# Patient Record
Sex: Male | Born: 1975 | Race: White | Hispanic: No | Smoking: Former smoker
Health system: Southern US, Community
[De-identification: ages and names within clinical notes are randomized; demographics above are authoritative.]

## PROBLEM LIST (undated history)

## (undated) DIAGNOSIS — I10 Essential (primary) hypertension: Secondary | ICD-10-CM

## (undated) DIAGNOSIS — T7840XA Allergy, unspecified, initial encounter: Secondary | ICD-10-CM

## (undated) DIAGNOSIS — E119 Type 2 diabetes mellitus without complications: Secondary | ICD-10-CM

## (undated) DIAGNOSIS — D649 Anemia, unspecified: Secondary | ICD-10-CM

## (undated) DIAGNOSIS — F419 Anxiety disorder, unspecified: Secondary | ICD-10-CM

## (undated) HISTORY — DX: Anxiety disorder, unspecified: F41.9

## (undated) HISTORY — DX: Type 2 diabetes mellitus without complications: E11.9

## (undated) HISTORY — PX: PLANTAR FASCIA SURGERY: SHX746

## (undated) HISTORY — DX: Anemia, unspecified: D64.9

## (undated) HISTORY — PX: CARPAL TUNNEL RELEASE: SHX101

## (undated) HISTORY — DX: Allergy, unspecified, initial encounter: T78.40XA

## (undated) HISTORY — PX: SPLENECTOMY, TOTAL: SHX788

---

## 2005-01-17 ENCOUNTER — Emergency Department: Payer: Self-pay | Admitting: General Practice

## 2007-08-18 ENCOUNTER — Emergency Department (HOSPITAL_COMMUNITY): Admission: EM | Admit: 2007-08-18 | Discharge: 2007-08-19 | Payer: Self-pay | Admitting: Emergency Medicine

## 2009-04-22 ENCOUNTER — Ambulatory Visit: Payer: Self-pay | Admitting: Orthopedic Surgery

## 2009-05-12 ENCOUNTER — Ambulatory Visit: Payer: Self-pay | Admitting: Orthopedic Surgery

## 2010-06-08 ENCOUNTER — Ambulatory Visit: Payer: Self-pay | Admitting: Sports Medicine

## 2010-12-14 LAB — COMPREHENSIVE METABOLIC PANEL
ALT: 34
Alkaline Phosphatase: 78
BUN: 12
CO2: 28
Chloride: 104
GFR calc non Af Amer: >60
Glucose, Bld: 91
Potassium: 4.4
Sodium: 140
Total Bilirubin: 0.7
Total Protein: 6.7

## 2010-12-14 LAB — URINE CULTURE
Colony Count: NO GROWTH
Culture: NO GROWTH

## 2010-12-14 LAB — CBC
HCT: 42.6
Hemoglobin: 15.3
RBC: 4.96
RDW: 12.1

## 2010-12-14 LAB — DIFFERENTIAL
Basophils Absolute: 0.1
Eosinophils Absolute: 0.3
Lymphocytes Relative: 20
Monocytes Absolute: 2.7 — ABNORMAL HIGH
Neutrophils Relative %: 57

## 2010-12-14 LAB — URINALYSIS, ROUTINE W REFLEX MICROSCOPIC
Glucose, UA: NEGATIVE
Hgb urine dipstick: NEGATIVE
Ketones, ur: NEGATIVE
Protein, ur: NEGATIVE
Urobilinogen, UA: 0.2

## 2010-12-14 LAB — CULTURE, BLOOD (ROUTINE X 2)
Culture: NO GROWTH
Culture: NO GROWTH

## 2010-12-14 LAB — ROCKY MTN SPOTTED FVR AB, IGM-BLOOD: RMSF IgM: 0.32 IV

## 2010-12-14 LAB — D-DIMER, QUANTITATIVE: D-Dimer, Quant: 0.51 — ABNORMAL HIGH

## 2010-12-14 LAB — ROCKY MTN SPOTTED FVR AB, IGG-BLOOD: RMSF IgG: <1:64 {titer}

## 2011-02-27 ENCOUNTER — Encounter: Payer: Self-pay | Admitting: Family Medicine

## 2011-02-27 ENCOUNTER — Ambulatory Visit (INDEPENDENT_AMBULATORY_CARE_PROVIDER_SITE_OTHER): Payer: BC Managed Care – PPO | Admitting: Family Medicine

## 2011-02-27 DIAGNOSIS — J069 Acute upper respiratory infection, unspecified: Secondary | ICD-10-CM

## 2011-02-27 DIAGNOSIS — Z8249 Family history of ischemic heart disease and other diseases of the circulatory system: Secondary | ICD-10-CM

## 2011-02-27 DIAGNOSIS — Z9081 Acquired absence of spleen: Secondary | ICD-10-CM

## 2011-02-27 DIAGNOSIS — D649 Anemia, unspecified: Secondary | ICD-10-CM

## 2011-02-27 DIAGNOSIS — E669 Obesity, unspecified: Secondary | ICD-10-CM

## 2011-02-27 DIAGNOSIS — Z9089 Acquired absence of other organs: Secondary | ICD-10-CM

## 2011-02-27 DIAGNOSIS — F17200 Nicotine dependence, unspecified, uncomplicated: Secondary | ICD-10-CM

## 2011-02-27 DIAGNOSIS — K6289 Other specified diseases of anus and rectum: Secondary | ICD-10-CM

## 2011-02-27 NOTE — Patient Instructions (Signed)
Let me know when you want to stop smoking. Let me know if the pain gets worse or more frequent. The cough and runny nose should gradually get better.  I would get a flu shot each fall (and as soon as you are feeling well this fall).   We'll request your records.  Come back for fasting labs in about 1 month.  You can get your results through our phone system.  Follow the instructions on the blue card. Take care.  Glad to see you.  I would get back in the gym when you are feeling better.

## 2011-02-28 ENCOUNTER — Encounter: Payer: Self-pay | Admitting: Family Medicine

## 2011-02-28 DIAGNOSIS — E669 Obesity, unspecified: Secondary | ICD-10-CM | POA: Insufficient documentation

## 2011-02-28 DIAGNOSIS — J40 Bronchitis, not specified as acute or chronic: Secondary | ICD-10-CM | POA: Insufficient documentation

## 2011-02-28 DIAGNOSIS — Z9081 Acquired absence of spleen: Secondary | ICD-10-CM | POA: Insufficient documentation

## 2011-02-28 DIAGNOSIS — K6289 Other specified diseases of anus and rectum: Secondary | ICD-10-CM | POA: Insufficient documentation

## 2011-02-28 DIAGNOSIS — F17200 Nicotine dependence, unspecified, uncomplicated: Secondary | ICD-10-CM | POA: Insufficient documentation

## 2011-02-28 NOTE — Assessment & Plan Note (Signed)
Possible transient pain from fissure, but normal exam today.   I would observe for now and if progressive he'll notify me.

## 2011-02-28 NOTE — Assessment & Plan Note (Signed)
requesting records

## 2011-02-28 NOTE — Assessment & Plan Note (Signed)
Precontemplative.  Cessation encouraged.

## 2011-02-28 NOTE — Assessment & Plan Note (Signed)
Resolving, likely viral, f/u prn.

## 2011-02-28 NOTE — Assessment & Plan Note (Signed)
Work on diet and exercise to lose weight.  He's done it before.  Goal to get weight off and keep it off.

## 2011-02-28 NOTE — Progress Notes (Signed)
New pt.  Records requested from [facility] clinic.   Pain in rectum, near perineum, episodic, brief, only with a BM, but not with all BMs.  Happens about 1x/month.  No blood in stool.    Recent URI.  Fever, eye watering, rhinorrhea, improved now.    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  Requesting records.   This appears to be familial, mult relatives with similar and s/p splenectomy.   Smoking, not yet ready to quit.    Overweight. Had lost ~100 lbs, but regained.  We talked about this today.    PMH and SH reviewed  ROS: See HPI, otherwise noncontributory.  Meds, vitals, and allergies reviewed.   GEN: nad, alert and oriented HEENT: mucous membranes moist, tm wnl, minimal nasal injection, op with minimal cobblestoning but no exudates NECK: supple w/o LA CV: rrr. PULM: ctab, no inc wob ABD: soft, +bs, old scar noted EXT: no edema SKIN: no acute rash Rectal exam- external- with no fissure noted, no thrombosed hemorrhoid

## 2011-03-16 ENCOUNTER — Encounter: Payer: Self-pay | Admitting: Family Medicine

## 2011-03-30 ENCOUNTER — Other Ambulatory Visit: Payer: BC Managed Care – PPO

## 2011-08-29 ENCOUNTER — Ambulatory Visit (INDEPENDENT_AMBULATORY_CARE_PROVIDER_SITE_OTHER): Payer: BC Managed Care – PPO | Admitting: Family Medicine

## 2011-08-29 ENCOUNTER — Encounter: Payer: Self-pay | Admitting: Family Medicine

## 2011-08-29 VITALS — BP 116/72 | HR 68 | Temp 97.8°F | Wt 304.8 lb

## 2011-08-29 DIAGNOSIS — J40 Bronchitis, not specified as acute or chronic: Secondary | ICD-10-CM

## 2011-08-29 DIAGNOSIS — Z9089 Acquired absence of other organs: Secondary | ICD-10-CM

## 2011-08-29 DIAGNOSIS — Z9081 Acquired absence of spleen: Secondary | ICD-10-CM

## 2011-08-29 MED ORDER — ALBUTEROL SULFATE HFA 108 (90 BASE) MCG/ACT IN AERS: 2.0000 | INHALATION_SPRAY | Freq: Four times a day (QID) | RESPIRATORY_TRACT | Status: DC | PRN

## 2011-08-29 MED ORDER — AZITHROMYCIN 250 MG PO TABS: ORAL_TABLET | ORAL | Status: AC

## 2011-08-29 NOTE — Progress Notes (Signed)
Sinus and chest congestion.  Going on about 1 week.  Burning in nose initally then a lot of nasal discharge.  No fevers.  Not sob, but more wheezing than normal. Some sputum.  No ST.  No myalgias.  He's not getting worse but isn't sig better in last few days.  Prev with upper tooth pain, bilaterally.  No ear pain, but ears feel stuffy.  Smoking 1 PPD, discussed.  He's not ready to quit yet.  Asplenic.    Meds, vitals, and allergies reviewed.   ROS: See HPI.  Otherwise, noncontributory.  nad ncat Tm wnl Nasal exam stuffy OP w/o exudates Mmm Neck supple rrr Coarse BS and scant wheeze/rhonchi Frontal sinus ttp B Ext w/o edema

## 2011-08-29 NOTE — Assessment & Plan Note (Signed)
Needs another PNA shot this summer.  D/w pt .  He understands.

## 2011-08-29 NOTE — Assessment & Plan Note (Signed)
Start zmax.  Use SABA.  Needs to stop smoking.  Pre contemplative.  Nontoxic.  See instructions.

## 2011-08-29 NOTE — Patient Instructions (Signed)
Start the antibiotics today, use the inhaler as needed and let me know if I can help you stop smoking.   Call about getting a repeat pneumonia shot later this summer when you are feeling better.

## 2011-11-07 ENCOUNTER — Telehealth: Payer: Self-pay | Admitting: Family Medicine

## 2011-11-07 ENCOUNTER — Telehealth: Payer: Self-pay

## 2011-11-07 NOTE — Telephone Encounter (Signed)
[name] with [facility]; pt stepped on nail 11/06/11; Angelique Blonder not sure if rusty or new nail;[name] to get more info; Angelique Blonder has checked [place] registry and pts chart for immunizations; unable to locate; Could not find on scanned [facility] notes; Pt is not in centricity and does not have paper chart here. Angelique Blonder said will make appt for pt to be seen and f/u on tetanus immun.

## 2011-11-07 NOTE — Telephone Encounter (Signed)
If this punctured the skin then I would advise him to be seen today, either here or UC.

## 2011-11-07 NOTE — Telephone Encounter (Signed)
Caller: Tammy/Spouse; Patient Name: [name]; PCP: Crawford Givens Clelia Croft); Best Callback Phone Number: [phone].  Spouse is calling to find out tetanus info for pt. Spouse is unsure of last tetanus shot. No tetanus immunization record found in the EMR nor [facility]. Office could not find information either.   Pt stepped on nail 11/06/11 and is it sore. No signs of infection noted.  All emergent symptoms of Abrasions, Lacerations, Puncture Wounds Protocol ruled out with exception to 'Tetanus immunization not up to date'.  Pt offered appt for 11/08/11 but spouse did not know pts work schedule.  Spouse to call back later for appt.

## 2011-11-07 NOTE — Telephone Encounter (Signed)
Called patient and he says the nail did go up into his foot a little bit.  It bled a little but not much.  He thinks his Tetanus shot is up to date but as of now, he nor [facility] have been able to document that.  Dr. Para March recommends that he be seen today, apparently at [facility] now.  Patient advised and says he doesn't think he can go to UC today.  Appt made with Dr. Patsy Lager for 11/08/11.

## 2011-11-08 ENCOUNTER — Ambulatory Visit: Payer: BC Managed Care – PPO | Admitting: Family Medicine

## 2012-01-05 ENCOUNTER — Telehealth: Payer: Self-pay

## 2012-01-05 NOTE — Telephone Encounter (Signed)
Pts wife left v/m;Pt already has appt to see Dr Para March 01/11/12 for shoulder numbness and dizziness; pt usually feels better after eats. Should pt come prior to appt for fasting labs.Please advise.

## 2012-01-07 NOTE — Telephone Encounter (Signed)
Was supposed to come back for fasting labs months ago.  Orders are still in.

## 2012-01-08 NOTE — Telephone Encounter (Signed)
LMOVM to Tammy.

## 2012-01-10 ENCOUNTER — Other Ambulatory Visit (INDEPENDENT_AMBULATORY_CARE_PROVIDER_SITE_OTHER): Payer: BC Managed Care – PPO

## 2012-01-10 DIAGNOSIS — D649 Anemia, unspecified: Secondary | ICD-10-CM

## 2012-01-10 DIAGNOSIS — Z8249 Family history of ischemic heart disease and other diseases of the circulatory system: Secondary | ICD-10-CM

## 2012-01-10 LAB — CBC WITH DIFFERENTIAL/PLATELET
Basophils Relative: 0.5 % (ref 0.0–3.0)
HCT: 45.4 % (ref 39.0–52.0)
Hemoglobin: 15.7 g/dL (ref 13.0–17.0)
Lymphocytes Relative: 23.1 % (ref 12.0–46.0)
Lymphs Abs: 2.9 10*3/uL (ref 0.7–4.0)
Monocytes Relative: 10.1 % (ref 3.0–12.0)
Neutro Abs: 8.1 10*3/uL — ABNORMAL HIGH (ref 1.4–7.7)
RBC: 5.2 Mil/uL (ref 4.22–5.81)

## 2012-01-10 LAB — LIPID PANEL
HDL: 24.6 mg/dL — ABNORMAL LOW (ref >39.00)
LDL Cholesterol: 133 mg/dL — ABNORMAL HIGH (ref 0–99)
Total CHOL/HDL Ratio: 7
VLDL: 19.2 mg/dL (ref 0.0–40.0)

## 2012-01-10 LAB — GLUCOSE, RANDOM: Glucose, Bld: 117 mg/dL — ABNORMAL HIGH (ref 70–99)

## 2012-01-11 ENCOUNTER — Ambulatory Visit: Payer: BC Managed Care – PPO | Admitting: Family Medicine

## 2012-01-12 ENCOUNTER — Ambulatory Visit (INDEPENDENT_AMBULATORY_CARE_PROVIDER_SITE_OTHER): Payer: BC Managed Care – PPO | Admitting: Family Medicine

## 2012-01-12 ENCOUNTER — Encounter: Payer: Self-pay | Admitting: Family Medicine

## 2012-01-12 VITALS — BP 114/70 | HR 75 | Temp 98.6°F | Wt 308.0 lb

## 2012-01-12 DIAGNOSIS — R7309 Other abnormal glucose: Secondary | ICD-10-CM

## 2012-01-12 DIAGNOSIS — R739 Hyperglycemia, unspecified: Secondary | ICD-10-CM

## 2012-01-12 DIAGNOSIS — M25519 Pain in unspecified shoulder: Secondary | ICD-10-CM

## 2012-01-12 NOTE — Progress Notes (Signed)
R handed.  He'll get unilateral and occ bilateral aching/burning in shoulders.  One can happen w/o the other.  No trauma to explain the sx. No neck or back pain.    He'll get some L arm changes in sensation (but not absence of sensation) but this doesn't happen at the same time as the shoulder pain listed above.  All going on for about 1 year. Sensation changes happen about 2x/month.  No triggers with movements, lifting.  No trauma, falls, MVC etc.  H/o heavy weight lifting in the past. H/o CTS surgery in past.    Hyperglycemia. Noted on labs.  D/w pt about weight and need to monitor diet.  Will plan to recheck sugar in another 6 months.  He agrees.    Meds, vitals, and allergies reviewed.   ROS: See HPI.  Otherwise, noncontributory.  nad ncat Mmm Neck supple, normal neck ROM, no midline pain Normal shoulder rom w/o cuff impingement testing B but AC joints ttp B No tingling in the hand with wrist flexion or tapping the flexor retinaculum.

## 2012-01-12 NOTE — Patient Instructions (Addendum)
Recheck sugar in 6 months.  Fasting.  Work on losing weight.  Take ibuprofen (up to) 600mg  three times a day with food.  See if that helps.   If you aren't improving, we'll need to get you over to the ortho clinic.  Take care.

## 2012-01-14 DIAGNOSIS — Z8639 Personal history of other endocrine, nutritional and metabolic disease: Secondary | ICD-10-CM | POA: Insufficient documentation

## 2012-01-14 DIAGNOSIS — M25519 Pain in unspecified shoulder: Secondary | ICD-10-CM | POA: Insufficient documentation

## 2012-01-14 NOTE — Assessment & Plan Note (Signed)
Take ibuprofen (up to) 600mg  three times a day with food. If you aren't improving, we'll refer to the ortho clinic. He has a history of heavy weight lifting and this is likely playing a role.  I don't see the obvious cause of the intermittent sensation changes.  He has normal strength and sensation today, normal DTRs.  Would follow clinically.  If progressive, we'll w/u.

## 2012-01-14 NOTE — Assessment & Plan Note (Signed)
D/w pt about diet, weight loss, recheck sugar in 6 months.

## 2013-01-22 ENCOUNTER — Encounter: Payer: Self-pay | Admitting: Podiatry

## 2013-01-22 ENCOUNTER — Ambulatory Visit (INDEPENDENT_AMBULATORY_CARE_PROVIDER_SITE_OTHER): Payer: BC Managed Care – PPO | Admitting: Podiatry

## 2013-01-22 ENCOUNTER — Ambulatory Visit (INDEPENDENT_AMBULATORY_CARE_PROVIDER_SITE_OTHER): Payer: BC Managed Care – PPO

## 2013-01-22 VITALS — BP 131/75 | HR 90 | Resp 16 | Ht 72.0 in | Wt 315.0 lb

## 2013-01-22 DIAGNOSIS — Q6650 Congenital pes planus, unspecified foot: Secondary | ICD-10-CM

## 2013-01-22 DIAGNOSIS — M19079 Primary osteoarthritis, unspecified ankle and foot: Secondary | ICD-10-CM

## 2013-01-22 DIAGNOSIS — B353 Tinea pedis: Secondary | ICD-10-CM

## 2013-01-22 DIAGNOSIS — M79671 Pain in right foot: Secondary | ICD-10-CM

## 2013-01-22 DIAGNOSIS — M79609 Pain in unspecified limb: Secondary | ICD-10-CM

## 2013-01-22 MED ORDER — METHYLPREDNISOLONE (PAK) 4 MG PO TABS: ORAL_TABLET | ORAL | Status: DC

## 2013-01-22 MED ORDER — MELOXICAM 15 MG PO TABS: 15.0000 mg | ORAL_TABLET | Freq: Every day | ORAL | Status: DC

## 2013-01-22 NOTE — Progress Notes (Signed)
N HURT L B/L FOOT-DORSAL D 45M O SLOWLY C WORSE A STANDING T 0   N NUMB L LEFT HEEL D 45M O SLOWLY C WORSE A 0 T 0

## 2013-01-22 NOTE — Progress Notes (Signed)
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 He states that he and his wife her on her feet all the time. Over the past month his developed pain throughout his mid foot arch and dorsal aspect of the foot this seems to be worsening. Seems to be better and shoe gear and then after he gets out of his shoe gear is painful to walk. He states he also has area of numbness centrally located in the plantar aspect of his left heel. He denies any trauma or any back abnormalities. 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 He did nothing to try to help with his pain.  Objective: I have reviewed his past medical history medications allergies and surgeries as well as his review of systems. Vital signs are stable he is alert and oriented x3. Vascular evaluation demonstrates strong palpable pulses +2/4 DP and PT bilateral capillary fill time to digits one through 5 bilateral foot is immediate. Neurologic sensorium per since once the monofilament is intact bilateral. Deep tendon reflexes are intact and symmetrical bilateral. Muscle strength is +5 over 5 dorsiflexors plantar flexors inverters and evertors bilaterally. All intrinsic musculature is intact. Orthopedic evaluation demonstrates pain on palpation to the dorsal aspect of the midfoot. Frontal plane range of motion bilateral foot. Pes planus flexible in nature bilateral. Radiographic evaluation demonstrates early osteoarthritic changes dorsal midfoot lateral view bilateral. Right first metatarsophalangeal joint demonstrates moderate osteoarthritic changes with hallux limitus. Cutaneous evaluation demonstrates supple well hydrated cutis to the dorsal aspect of the bilateral foot. Plantar aspect of the foot does demonstrate reactive hyperkeratosis, bromhidrosis and minor fissuring. He has a small reactive hyperkeratotic lesion plantar medial aspect great toe left  foot.  Assessment: Pes planus bilateral. Midfoot osteoarthritic changes bilateral. Hallux limitus first metatarsophalangeal joint right. Tinea pedis bilateral.  Plan: At this point I prescribed him orthotics. He was scan today. Wrote him a prescription for Sterapred Dosepak to be followed by Mobic. And we discussed a little chloride application for the bromhidrosis. Also discussed appropriate shoe gear stretching exercises ice therapy. Followup with him with his orthotics committed.

## 2013-03-17 ENCOUNTER — Encounter: Payer: Self-pay | Admitting: Podiatry

## 2013-03-17 ENCOUNTER — Ambulatory Visit (INDEPENDENT_AMBULATORY_CARE_PROVIDER_SITE_OTHER): Payer: BC Managed Care – PPO | Admitting: Podiatry

## 2013-03-17 VITALS — BP 123/77 | HR 96 | Resp 16 | Ht 72.0 in | Wt 320.0 lb

## 2013-03-17 DIAGNOSIS — M722 Plantar fascial fibromatosis: Secondary | ICD-10-CM

## 2013-03-17 NOTE — Patient Instructions (Signed)

## 2013-03-17 NOTE — Progress Notes (Signed)
Rider presents today to pick up his orthotics for his flat feet and plantar fasciitis left.  Objective: Pulses are palpable bilateral. Pain on palpation medial calcaneal tubercle left heel.  Assessment: Plantar fasciitis left pes planus bilateral.  Plan: Since orthotics today. Was given both oral and written home-going instructions for the use of his orthotics. Was injected his left heel today with Kenalog and local anesthetic.

## 2013-04-14 ENCOUNTER — Ambulatory Visit: Payer: BC Managed Care – PPO | Admitting: Podiatry

## 2013-06-26 ENCOUNTER — Ambulatory Visit (INDEPENDENT_AMBULATORY_CARE_PROVIDER_SITE_OTHER): Payer: BC Managed Care – PPO | Admitting: Podiatry

## 2013-06-26 VITALS — Resp 16 | Ht 72.0 in | Wt 320.0 lb

## 2013-06-26 DIAGNOSIS — M722 Plantar fascial fibromatosis: Secondary | ICD-10-CM

## 2013-06-26 NOTE — Progress Notes (Signed)
He presents today stating that he has had no relief from conservative therapies of plantar fasciitis of his left heel. He states that his heel is been hurting so bad that he had active is the warts up out of work April 2. He can hardly bear weight on this foot particularly in the morning and then again at the end of the day.  Objective: Vital signs are stable he is alert and oriented x3. He has severe pain on palpation medial calcaneal tubercle of the left heel. He has tenderness on lateral aspect of the fifth metatarsal base and fifth metatarsal head left foot.  Assessment: Chronic intractable plantar fasciitis left heel.  Plan: Discussed etiology pathology conservative versus surgical therapies. I put him in a Cam Walker and scheduled him for an MRI of his left heel secondary to chronic intractable plantar fasciitis of his left foot. Once the MRI comes back positive for under fasciitis endoscopic plantar fasciotomy will be performed.

## 2013-07-02 ENCOUNTER — Telehealth: Payer: Self-pay | Admitting: *Deleted

## 2013-07-02 NOTE — Telephone Encounter (Signed)
Per Dr. Milinda Pointer, I called and informed the patient that we received his MRI results.   Dr. Milinda Pointer wants him to schedule a follow-up visit.  I transferred him to a scheduler.

## 2013-07-07 ENCOUNTER — Ambulatory Visit (INDEPENDENT_AMBULATORY_CARE_PROVIDER_SITE_OTHER): Payer: BC Managed Care – PPO | Admitting: Podiatry

## 2013-07-07 VITALS — Resp 16 | Ht 72.0 in | Wt 320.0 lb

## 2013-07-07 DIAGNOSIS — M722 Plantar fascial fibromatosis: Secondary | ICD-10-CM

## 2013-07-07 NOTE — Progress Notes (Signed)
He's here today for his MRI read.  Objective: Vital signs are stable he is alert and oriented x3 MRI report did come back positive for plantar fasciitis medial band left foot with posterior tibial tendinitis.  Assessment: Chronic intractable plantar fasciitis left foot.  Plan: We will over consent form today line bylined number by number giving him ask questions he saw fit regarding an endoscopic plantar fasciotomy left heel and strong questions regarding this procedure the best my ability in layman's terms she understood it was amenable to it signed all 3 pages of the consent form.

## 2013-07-08 ENCOUNTER — Encounter: Payer: Self-pay | Admitting: Podiatry

## 2013-07-11 ENCOUNTER — Encounter: Payer: Self-pay | Admitting: Podiatry

## 2013-07-11 DIAGNOSIS — M722 Plantar fascial fibromatosis: Secondary | ICD-10-CM

## 2013-07-15 ENCOUNTER — Encounter: Payer: Self-pay | Admitting: *Deleted

## 2013-07-15 ENCOUNTER — Telehealth: Payer: Self-pay | Admitting: *Deleted

## 2013-07-15 NOTE — Telephone Encounter (Signed)
Called and spoke with pt regarding surgery on 4.24.15. States he is doing fine, elevating, staying off of foot, icing and taking all rx by dr Milinda Pointer. verified pts appt with him. Pt understood.

## 2013-07-15 NOTE — Progress Notes (Signed)
Endoscopic plantar fasciotomy (epf) left   Rx: Percocet 10/325 #40 0 refills take one to two tabs by mouth every 6 - 8 hrs for pain Phenergan 25 mg #30 0 refills take one by mouth every 6 - 8 hrs for nausea Keflex 500 mg #30 0 refills one by mouth three times daily

## 2013-07-17 ENCOUNTER — Ambulatory Visit (INDEPENDENT_AMBULATORY_CARE_PROVIDER_SITE_OTHER): Payer: BC Managed Care – PPO | Admitting: Podiatrist

## 2013-07-17 ENCOUNTER — Encounter: Payer: BC Managed Care – PPO | Admitting: Podiatry

## 2013-07-17 VITALS — BP 144/88 | HR 88 | Temp 98.7°F | Resp 16

## 2013-07-17 DIAGNOSIS — M722 Plantar fascial fibromatosis: Secondary | ICD-10-CM

## 2013-07-17 MED ORDER — MEPERIDINE HCL 50 MG PO TABS: 50.0000 mg | ORAL_TABLET | ORAL | Status: DC | PRN

## 2013-07-17 MED ORDER — PROMETHAZINE HCL 25 MG PO TABS: 25.0000 mg | ORAL_TABLET | Freq: Four times a day (QID) | ORAL | Status: DC | PRN

## 2013-07-17 NOTE — Progress Notes (Signed)
Subjective: Patient presents today1 week status post foot surgery of the left foot.  Date of surgery 07/11/2013-  EPF left foot. Patient denies nausea, vomiting, fevers, chills or night sweats.  Denies calf pain or tenderness to the operative side. Relates he does have a scratchy throat and felt feverish.  Temperature taken today and patient is afebrile. He has been wearing the boot as instructed.  He relates moderate pain along the medial incision site.   Objective:  Neurovascular status is intact with palpable pedal pulses DP and PT bilateral at 2+ out of 4. Neurological sensation is intact and unchanged as per prior to surgery. Excellent appearance of the postoperative foot is noted. Mild swelling at the medial incision site is seen.  No redness, no drainage, no malodor, no streaking or signs of infection are seen.  No calf pain is elicited with compression or with dorsiflexion;plantarflexion  Assessment: Status post Endoscopic Plantar Fascia release left foot  Plan:  Dressed the incision sites with sterile bandaids and applied an ace wrap as he has quite a large foot and believe the anklet would be too tight at this time for him to wear.  I recommended soaking the foot in epsom salt water after a shower, and reapplying the bandaids to the incision sites.  He will continue to wear the boot daily and at night.  He will be seen back in 1 week for suture removal and follow up.  He is also instructed to continue icing and elevation.   A prescription for Demerol and Phenergan was dispensed in hopes this would help with the scratchy throat feeling he gets when taking the oxycodone.

## 2013-07-21 ENCOUNTER — Ambulatory Visit (INDEPENDENT_AMBULATORY_CARE_PROVIDER_SITE_OTHER): Payer: BC Managed Care – PPO | Admitting: Internal Medicine

## 2013-07-21 ENCOUNTER — Encounter: Payer: Self-pay | Admitting: Internal Medicine

## 2013-07-21 VITALS — BP 128/82 | HR 89 | Temp 98.5°F | Wt 328.0 lb

## 2013-07-21 DIAGNOSIS — J309 Allergic rhinitis, unspecified: Secondary | ICD-10-CM

## 2013-07-21 NOTE — Patient Instructions (Addendum)
Allergic Rhinitis Allergic rhinitis is when the mucous membranes in the nose respond to allergens. Allergens are particles in the air that cause your body to have an allergic reaction. This causes you to release allergic antibodies. Through a chain of events, these eventually cause you to release histamine into the blood stream. Although meant to protect the body, it is this release of histamine that causes your discomfort, such as frequent sneezing, congestion, and an itchy, runny nose.  CAUSES  Seasonal allergic rhinitis (hay fever) is caused by pollen allergens that may come from grasses, trees, and weeds. Year-round allergic rhinitis (perennial allergic rhinitis) is caused by allergens such as house dust mites, pet dander, and mold spores.  SYMPTOMS   Nasal stuffiness (congestion).  Itchy, runny nose with sneezing and tearing of the eyes. DIAGNOSIS  Your health care provider can help you determine the allergen or allergens that trigger your symptoms. If you and your health care provider are unable to determine the allergen, skin or blood testing may be used. TREATMENT  Allergic Rhinitis does not have a cure, but it can be controlled by:  Medicines and allergy shots (immunotherapy).  Avoiding the allergen. Hay fever may often be treated with antihistamines in pill or nasal spray forms. Antihistamines block the effects of histamine. There are over-the-counter medicines that may help with nasal congestion and swelling around the eyes. Check with your health care provider before taking or giving this medicine.  If avoiding the allergen or the medicine prescribed do not work, there are many new medicines your health care provider can prescribe. Stronger medicine may be used if initial measures are ineffective. Desensitizing injections can be used if medicine and avoidance does not work. Desensitization is when a patient is given ongoing shots until the body becomes less sensitive to the allergen.  Make sure you follow up with your health care provider if problems continue. HOME CARE INSTRUCTIONS It is not possible to completely avoid allergens, but you can reduce your symptoms by taking steps to limit your exposure to them. It helps to know exactly what you are allergic to so that you can avoid your specific triggers. SEEK MEDICAL CARE IF:   You have a fever.  You develop a cough that does not stop easily (persistent).  You have shortness of breath.  You start wheezing.  Symptoms interfere with normal daily activities. Document Released: 11/29/2000 Document Revised: 12/25/2012 Document Reviewed: 11/11/2012 Exit[employer] Patient Information 2014 [employer].  

## 2013-07-21 NOTE — Progress Notes (Signed)
Pre visit review using our clinic review tool, if applicable. No additional management support is needed unless otherwise documented below in the visit note. 

## 2013-07-21 NOTE — Progress Notes (Signed)
 Subjective:    Patient ID: [name], male    DOB: 05/01/1975, 38 y.o.   MRN: [id number]  HPI  Pt presents to the clinic today with c/o cough and sore throat. He reports this started 1 week ago. He has had some pain with swallowing. The cough is productive of thick yellow phlegm. He has had some shortness of breath, headache, sinus pressure. He denies fever, chills or body aches. He has not tried anything OTC. He does have a history of allergies but denies breathing problems. He has has not had sick contacts that he is aware of.  Review of Systems      Past Medical History  Diagnosis Date  . Allergy   . 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 meperidine (DEMEROL) 50 MG tablet Take 1 tablet (50 mg total) by mouth every 4 (four) hours as needed.  30 tablet  0  . oxyCODONE-acetaminophen (PERCOCET) 10-325 MG per tablet Take 1 tablet by mouth every 6 (six) hours as needed for pain.      . promethazine (PHENERGAN) 25 MG tablet Take 1 tablet (25 mg total) by mouth every 6 (six) hours as needed for nausea or vomiting.  30 tablet  1   No current facility-administered medications for this visit.    Allergies  Allergen Reactions  . Aspirin     Nosebleeds    Family History  Problem Relation Age of Onset  . Arthritis Mother   . Diabetes Mother   . Hypertension Mother   . Hyperlipidemia Mother   . Heart disease Mother   . Arthritis Father   . Diabetes Father   . Anemia Maternal Grandmother     History   Social History  . Marital Status: Married    Spouse Name: N/A    Number of Children: N/A  . Years of Education: N/A   Occupational History  . Not on file.   Social History Main Topics  . Smoking status: Former Smoker -- 1.00 packs/day for 17 years    Types: Cigarettes  . Smokeless tobacco: Never Used     Comment: since 02/2013  . Alcohol Use:  No  . Drug Use: No  . Sexual Activity: Not on file   Other Topics Concern  . 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 Denies fever, malaise, fatigue, headache or abrupt weight changes.  HEENT: Pt reports sore throat. Denies eye pain, eye redness, ear pain, ringing in the ears, wax buildup, runny nose, nasal congestion, bloody nose. Respiratory: Pt reports shortness of breath,  cough. Denies difficulty breathing, cough or sputum production.     No other specific complaints in a complete review of systems (except as listed in HPI above).  Objective:   Physical Exam   BP 128/82  Pulse 89  Temp(Src) 98.5 F (36.9 C) (Oral)  Wt 328 lb (148.78 kg)  SpO2 98% Wt Readings from Last 3 Encounters:  07/21/13 328 lb (148.78 kg)  07/07/13 320 lb (145.151 kg)  06/26/13 320 lb (145.151 kg)    General: Appears his stated age, obese but  well developed, well nourished in NAD. HEENT: Head: normal shape and size; Eyes: sclera injected, no icterus, conjunctiva pink, PERRLA and EOMs intact; Ears: Tm's gray   and intact, normal light reflex; Nose: mucosa boggy and moist, septum midline; Throat/Mouth: Teeth present, mucosa eythematous and moist, + PND, no exudate, lesions or ulcerations noted.   Cardiovascular: Normal rate and rhythm. S1,S2 noted.  No murmur, rubs or gallops noted. No JVD or BLE edema. No carotid bruits noted. Pulmonary/Chest: Normal effort and positive vesicular breath sounds. No respiratory distress. No wheezes, rales or ronchi noted.    BMET    Component Value Date/Time   NA 140 08/18/2007 2205   K 4.4 08/18/2007 2205   CL 104 08/18/2007 2205   CO2 28 08/18/2007 2205   GLUCOSE 117* 01/10/2012 0905   BUN 12 08/18/2007 2205   CREATININE 1.03 08/18/2007 2205   CALCIUM 9.2 08/18/2007 2205   GFRNONAA >60 08/18/2007 2205   GFRAA  Value: >60        The eGFR has been  calculated using the MDRD equation. This calculation has not been validated in all clinical 08/18/2007 2205    Lipid Panel     Component Value Date/Time   CHOL 177 01/10/2012 0905   TRIG 96.0 01/10/2012 0905   HDL 24.60* 01/10/2012 0905   CHOLHDL 7 01/10/2012 0905   VLDL 19.2 01/10/2012 0905   LDLCALC 133* 01/10/2012 0905    CBC    Component Value Date/Time   WBC 12.7* 01/10/2012 0905   RBC 5.20 01/10/2012 0905   HGB 15.7 01/10/2012 0905   HCT 45.4 01/10/2012 0905   PLT 444.0* 01/10/2012 0905   MCV 87.3 01/10/2012 0905   MCHC 34.6 01/10/2012 0905   RDW 12.6 01/10/2012 0905   LYMPHSABS 2.9 01/10/2012 0905   MONOABS 1.3* 01/10/2012 0905   EOSABS 0.3 01/10/2012 0905   BASOSABS 0.1 01/10/2012 0905    Hgb A1C No results found for this basename: HGBA1C        Assessment & Plan:   Allergic Rhinitis:  Try Zyrtec daily OTC Ibuprofen and Salt water gargles for the throat Watch for fever or worsening symptoms  RTC as needed or if symptoms persist or worsen 

## 2013-07-23 ENCOUNTER — Encounter: Payer: Self-pay | Admitting: Podiatry

## 2013-07-23 ENCOUNTER — Ambulatory Visit (INDEPENDENT_AMBULATORY_CARE_PROVIDER_SITE_OTHER): Payer: BC Managed Care – PPO | Admitting: Podiatry

## 2013-07-23 VITALS — BP 156/96 | HR 95 | Temp 97.6°F | Resp 16

## 2013-07-23 DIAGNOSIS — Z9889 Other specified postprocedural states: Secondary | ICD-10-CM

## 2013-07-23 NOTE — Progress Notes (Signed)
He presents today 12 days postop endoscopic plantar fasciotomy left foot. He states he is feeling very well at this point in time.  Objective: Vital signs stable he is alert and oriented x3. Pulses are palpable. Margins are well coapted sutures were removed today he has no palpable pain to the plantar medial calcaneal tubercle of the left heel.  Assessment: Well-healing endoscopic plantar fasciotomy x2 weeks  Plan: At this point sutures were removed today I encouraged him to wear his tennis shoes with his orthotics in them. He is to wear his night splint at nighttime x1 month. I will followup with him in 2 weeks.

## 2013-07-24 ENCOUNTER — Encounter: Payer: BC Managed Care – PPO | Admitting: Podiatry

## 2013-08-06 ENCOUNTER — Ambulatory Visit (INDEPENDENT_AMBULATORY_CARE_PROVIDER_SITE_OTHER): Payer: BC Managed Care – PPO | Admitting: Podiatry

## 2013-08-06 VITALS — BP 154/90 | HR 82 | Resp 16

## 2013-08-06 DIAGNOSIS — Z9889 Other specified postprocedural states: Secondary | ICD-10-CM

## 2013-08-06 NOTE — Progress Notes (Signed)
He presents today approximately one month now status post EPF of his left foot. He states it feels better but is still hard for him to walk on it.  Objective: Vital signs are stable he is alert and oriented x3. He has pain on palpation medial calcaneal tubercle of the left heel no more surgical pain than a plantar fasciitis type pain but he still has considerable scar tissue to resolve and I do not believe he is ready to return to work as of yet.  Assessment: Endoscopic plantar fasciotomy with residual pain left heel.  Plan: Continue the use of current therapies he will remain out of work 3 additional weeks.

## 2013-08-12 ENCOUNTER — Encounter: Payer: Self-pay | Admitting: Podiatry

## 2013-08-28 ENCOUNTER — Ambulatory Visit (INDEPENDENT_AMBULATORY_CARE_PROVIDER_SITE_OTHER): Payer: BC Managed Care – PPO | Admitting: Podiatry

## 2013-08-28 VITALS — BP 153/91 | HR 82 | Resp 16

## 2013-08-28 DIAGNOSIS — Z9889 Other specified postprocedural states: Secondary | ICD-10-CM

## 2013-08-28 DIAGNOSIS — I872 Venous insufficiency (chronic) (peripheral): Secondary | ICD-10-CM

## 2013-08-28 DIAGNOSIS — M722 Plantar fascial fibromatosis: Secondary | ICD-10-CM

## 2013-08-28 DIAGNOSIS — I878 Other specified disorders of veins: Secondary | ICD-10-CM

## 2013-08-28 MED ORDER — DICLOFENAC SODIUM 75 MG PO TBEC: 75.0000 mg | DELAYED_RELEASE_TABLET | Freq: Two times a day (BID) | ORAL | Status: DC

## 2013-08-29 NOTE — Progress Notes (Signed)
He presents today status post EPF left foot. He states that the fasciotomy site is doing quite well with the lateral aspect of the foot hurts as well as the dorsal aspect of the foot it feels like there is a band right across here as he points across the top of the foot. He also states that A. pain.  Objective: Vital signs are stable he is alert and oriented x3. Pulses are strongly palpable bilateral. He has pain on palpation to the dorsal lateral aspect of the left foot with inversion of the forefoot he develops pain overlying the fourth and fifth metatarsal cuboid articulation. This is consistent with the radiographic findings of arthritis in Lisfranc joint area. He also has venous insufficiency and varicosities to the medial calf and knee.  Assessment: Status post EPF left foot. Hurt osseous left foot. Arthritis left foot.  Plan: Start diclofenac 75 mg 1 twice a day. He will return to work next week. He will followup with me in 2-3 weeks. He will continue all other conservative therapies.

## 2013-09-11 ENCOUNTER — Encounter: Payer: BC Managed Care – PPO | Admitting: Podiatry

## 2013-10-15 ENCOUNTER — Encounter: Payer: Self-pay | Admitting: Podiatry

## 2013-10-15 ENCOUNTER — Ambulatory Visit (INDEPENDENT_AMBULATORY_CARE_PROVIDER_SITE_OTHER): Payer: BC Managed Care – PPO | Admitting: Podiatry

## 2013-10-15 VITALS — BP 134/86 | HR 78 | Resp 16

## 2013-10-15 DIAGNOSIS — Z9889 Other specified postprocedural states: Secondary | ICD-10-CM

## 2013-10-15 DIAGNOSIS — M629 Disorder of muscle, unspecified: Secondary | ICD-10-CM

## 2013-10-15 MED ORDER — METHYLPREDNISOLONE (PAK) 4 MG PO TABS: ORAL_TABLET | ORAL | Status: DC

## 2013-10-15 NOTE — Progress Notes (Signed)
He presents today now several weeks status post EPF left foot he was much improved last time I saw him however his complaining today of more pain to the plantar lateral heel and the lateral ankle.  Objective: Vital signs are stable he is alert and oriented x3. No erythema edema saline is drainage or odor. Palpation demonstrates what appears to be a interruption of the intermediate and lateral bands of the plantar fascia left. I think he ruptured his remaining plantar fascial band.  Assessment: Tear of the plantar fascial left status post EPF left.  Plan: We will send him to physical therapy. Also prescribed him a Sterapred Dosepak and placed him back in his [employer]. I will followup with him in 3 weeks

## 2013-11-10 ENCOUNTER — Ambulatory Visit (INDEPENDENT_AMBULATORY_CARE_PROVIDER_SITE_OTHER): Payer: BC Managed Care – PPO | Admitting: Podiatry

## 2013-11-10 ENCOUNTER — Ambulatory Visit (INDEPENDENT_AMBULATORY_CARE_PROVIDER_SITE_OTHER): Payer: BC Managed Care – PPO

## 2013-11-10 VITALS — BP 137/83 | HR 85 | Resp 16

## 2013-11-10 DIAGNOSIS — M722 Plantar fascial fibromatosis: Secondary | ICD-10-CM

## 2013-11-10 DIAGNOSIS — M778 Other enthesopathies, not elsewhere classified: Secondary | ICD-10-CM

## 2013-11-10 DIAGNOSIS — M779 Enthesopathy, unspecified: Secondary | ICD-10-CM

## 2013-11-10 DIAGNOSIS — M7750 Other enthesopathy of unspecified foot: Secondary | ICD-10-CM

## 2013-11-10 NOTE — Progress Notes (Signed)
He presents today for followup of his plantar fasciitis residual in nature status post EPF left foot. He recently started physical therapy for his plantar fasciitis and states it seems to be helping. However he states that his left ankle still hurts as he points to the sinus tarsi left and he is starting to develop plantar fasciitis of the right foot.  Objective: Vital signs are stable he is alert and oriented x3. He has pain on palpation medial calcaneal tubercle of the right heel right radiograph does demonstrate a soft tissue increase in density at the plantar fascial calcaneal insertion site. He still has pain on palpation of the sinus tarsi left less pain on palpation to the point of maximal tenderness previously noted at the plantar fascial calcaneal insertion site.  Assessment: Plantar fasciitis right. Sinus tarsitis capsulitis left. Residual plantar fasciitis status post EPF left.  Plan: Injected the right heel today with Kenalog and local anesthetic. Injected the left subtalar joint today with Kenalog and local anesthetic. And encouraged him to continue physical therapy and remain out of work until this has resolved.

## 2013-12-01 ENCOUNTER — Encounter: Payer: Self-pay | Admitting: Podiatry

## 2013-12-01 ENCOUNTER — Ambulatory Visit (INDEPENDENT_AMBULATORY_CARE_PROVIDER_SITE_OTHER): Payer: BC Managed Care – PPO | Admitting: Podiatry

## 2013-12-01 VITALS — BP 139/79 | HR 79 | Resp 12

## 2013-12-01 DIAGNOSIS — Z9889 Other specified postprocedural states: Secondary | ICD-10-CM

## 2013-12-01 DIAGNOSIS — M722 Plantar fascial fibromatosis: Secondary | ICD-10-CM

## 2013-12-01 MED ORDER — CELECOXIB 200 MG PO CAPS: 200.0000 mg | ORAL_CAPSULE | Freq: Every day | ORAL | Status: DC

## 2013-12-01 NOTE — Progress Notes (Signed)
Currently he is doing much better as she refers to the plantar fasciitis to the right foot and the subtalar joint capsulitis left foot. Physical therapy seems to be working well for him. The injections last visit seemed to work better for as well.  Objective: He will be do to get back to work the 23rd of the month. He has no reproducible pain as of today.  Assessment: Plantar fasciitis right chronic capsulitis left. Osteoarthritis bilateral.  Plan: He'll start back with physical therapy and we started him on Celebrex 200 mg once daily.

## 2014-01-12 ENCOUNTER — Ambulatory Visit: Payer: BC Managed Care – PPO | Admitting: Podiatry

## 2014-02-09 ENCOUNTER — Ambulatory Visit: Payer: Self-pay | Admitting: Urology

## 2014-02-09 LAB — COMPREHENSIVE METABOLIC PANEL
Albumin: 4 g/dL (ref 3.4–5.0)
Alkaline Phosphatase: 113 U/L
Anion Gap: 7 (ref 7–16)
BUN: 17 mg/dL (ref 7–18)
Bilirubin,Total: 0.3 mg/dL (ref 0.2–1.0)
CALCIUM: 9.5 mg/dL (ref 8.5–10.1)
CHLORIDE: 104 mmol/L (ref 98–107)
CO2: 28 mmol/L (ref 21–32)
Creatinine: 1.25 mg/dL (ref 0.60–1.30)
EGFR (African American): >60
EGFR (Non-African Amer.): >60
GLUCOSE: 92 mg/dL (ref 65–99)
Osmolality: 279 (ref 275–301)
POTASSIUM: 4.1 mmol/L (ref 3.5–5.1)
SGOT(AST): 31 U/L (ref 15–37)
SGPT (ALT): 41 U/L
SODIUM: 139 mmol/L (ref 136–145)
TOTAL PROTEIN: 8.4 g/dL — AB (ref 6.4–8.2)

## 2014-02-09 LAB — URINALYSIS, COMPLETE
BACTERIA: NONE SEEN
Bilirubin,UR: NEGATIVE
Glucose,UR: NEGATIVE mg/dL (ref 0–75)
Ketone: NEGATIVE
LEUKOCYTE ESTERASE: NEGATIVE
NITRITE: NEGATIVE
PH: 6 (ref 4.5–8.0)
Protein: 30
Specific Gravity: 1.024 (ref 1.003–1.030)
Squamous Epithelial: NONE SEEN

## 2014-02-09 LAB — CBC
HCT: 44 % (ref 40.0–52.0)
HGB: 15.4 g/dL (ref 13.0–18.0)
MCH: 29.4 pg (ref 26.0–34.0)
MCHC: 35.1 g/dL (ref 32.0–36.0)
MCV: 84 fL (ref 80–100)
PLATELETS: 523 10*3/uL — AB (ref 150–440)
RBC: 5.26 10*6/uL (ref 4.40–5.90)
RDW: 13.7 % (ref 11.5–14.5)
WBC: 22.4 10*3/uL — AB (ref 3.8–10.6)

## 2014-02-25 ENCOUNTER — Ambulatory Visit: Payer: Self-pay | Admitting: Urology

## 2014-07-11 NOTE — Op Note (Signed)
PATIENT NAME:  [name], [name] MR#:  [id number] DATE OF BIRTH:  1975-11-07  DATE OF PROCEDURE:  02/09/2014  PREOPERATIVE DIAGNOSIS: Left proximal ureteral stone, leukocytosis.   POSTOPERATIVE DIAGNOSIS: Left proximal ureteral stone, leukocytosis.   PROCEDURE PERFORMED: Left ureteral stent placement, cystoscopy.   ATTENDING SURGEON: Sherlynn Stalls, MD.   ANESTHESIA: General.   ESTIMATED BLOOD LOSS: Minimal.   DRAINS: A 6 x 26 French double-J ureteral stent.   SPECIMENS: None.   COMPLICATIONS: None.   INDICATION: This is a 39 year old male who presented to the Emergency Room with acute onset left flank pain, found to have a 6 mm obstructing stone and a leukocytosis to 22. His pain was poorly controlled in the Emergency Room, therefore, he was counseled to undergo emergent left ureteral stent placement. Risks and benefits of the procedure were explained in detail. The patient agreed to proceed with the planned procedure.   DESCRIPTION OF PROCEDURE: The patient was correctly identified in the preoperative holding area and informed consent was confirmed. He was brought to the operating suite and placed onto the table in the supine position. At this time, a universal timeout protocol was performed. All team members were identified. Venodyne boots were placed and he was administered IV Levaquin in the perioperative period. He was then placed under general anesthesia, intubated and repositioned lower on the bed in the dorsal lithotomy position. He was prepped and draped in standard surgical fashion. At this point in time, a rigid cystoscope was advanced per urethra into the bladder and attention was turned to the left ureteral orifice. This was then cannulated using a 5 [place] open-ended ureteral catheter just within the UO and a sensor wire was able to be placed up to the level of the kidney without difficulty alongside the stone. No retrograde pyelogram was performed in order to reduce the amount of  pyelosinus backflow in the event of possible sepsis. A 6 x 26 French double-J ureteral stent was then advanced over the wire to the level of the renal pelvis. The wire was partially withdrawn. A coil was noted within the renal pelvis. The wire was then fully withdrawn and a coil was noted within the bladder. The bladder was drained. The patient was then repositioned to the supine position, reversed from anesthesia and taken to the PACU in stable condition. There were no complications in this case.    ____________________________ Sherlynn Stalls, MD ajb:at D: 02/10/2014 08:09:00 ET T: 02/10/2014 11:09:06 ET JOB#: [id number]  cc: Sherlynn Stalls, MD, <Dictator> Sherlynn Stalls MD ELECTRONICALLY SIGNED 03/04/2014 10:50

## 2014-07-11 NOTE — Op Note (Signed)
PATIENT NAME:  ZERIC, [name] MR#:  [id number] DATE OF BIRTH:  10-02-1975  DATE OF PROCEDURE:  02/25/2014  PREOPERATIVE DIAGNOSIS: Left proximal ureteral stone.  POSTOPERATIVE DIAGNOSIS: Left proximal ureteral stone.   PROCEDURE PERFORMED: Left ureteroscopy, laser lithotripsy, left ureteral stent exchange.   ATTENDING SURGEON: Sherlynn Stalls, MD  ANESTHESIA: General anesthesia.   DRAINS: A 6 x 26 French double-J ureteral stent on a string (left side).   SPECIMENS: None.   COMPLICATIONS: None.   INDICATION: This is a 39 year old male who previously presented to the Emergency Room with acute onset left flank pain, who was found to have leukocytosis and a 6 mm proximal ureteral stone with poorly controlled pain. He was taken emergently to the Operating Room for left ureteral stent placement. He returns today for definitive management of his stone. Risks and benefits of the procedure were explained in detail. The patient agreed to proceed as planned.   PROCEDURE IN DETAIL: The patient was correctly identified in the preoperative holding area and informed consent was confirmed. He was brought to the operating suite and placed on the table in the supine position. At this time, a universal timeout protocol was performed. All team members were identified. Venodyne boots were placed, and she was administered 500 mg of IV Levaquin in the perioperative period. He was then placed under general anesthesia and repositioned in the dorsal lithotomy position. He was prepped and draped in the standard surgical fashion.   At this point in time, a rigid cystoscope was advanced per urethra into the bladder. The distal coil of the left ureteral stent was seen emanating from the left UO; this was grasped using a rigid stent graspers and brought out to the level of the urethral meatus. The stent was then cannulated using a Sensor Wire up to the level of the kidney, passing the stone without difficulty. The stone  shadow could be seen in this location in the proximal ureter. The stent was then removed, leaving the wire in place. A dual-lumen ureteral catheter was then used to introduce a second Sensor Wire up to the level of the kidney without difficulty. One wire was then snapped in place as a safety wire, and the other was used as a working wire. A 7 French flexible ureteroscope was then advanced over the working wire to the level of the proximal ureter without difficulty under fluoroscopic guidance. The stone was seen at the level of the UPJ. A 273 micron laser fiber was then used at the setting of 0.2 Hz and 50 joules to dust this stone. The stone was pushed up into an upper pole calyx which helped facilitate dusting. This was fragmented into extremely small pieces, less than 0.5 mm in size, and the remainder of the calyces were then directly visualized.   A retrograde pyelogram was performed, at the level of the UPJ through the scope, to ensure that all calyces had been directly visualized, which was confirmed. The scope was then backed down the length of the entire ureter, and the ureter was inspected. There was no evidence of ureteral edema or trauma, and no obstructing stone fragments seen.   The safety wire was then back loaded over a rigid cystoscope and a 6 x 26 double-J ureteral stent on a string was advanced to the level of the UPJ. The wire was withdrawn and a coil was noted within the renal pelvis. The wire was then fully withdrawn, a coil was noted within the bladder.   The  bladder was then drained, and the stent string was secured to the glans using Mastisol and Tegaderm. The patient was repositioned and was reversed from anesthesia and taken to the PACU in stable condition. There were no complications in this case.    ____________________________ Sherlynn Stalls, MD ajb:MT D: 02/25/2014 17:50:07 ET T: 02/25/2014 20:38:20 ET JOB#: [id number]  cc: Sherlynn Stalls, MD, <Dictator> Sherlynn Stalls MD ELECTRONICALLY SIGNED 03/04/2014 10:58

## 2014-07-11 NOTE — Consult Note (Signed)
spleenectomy:   Lab Results:  Hepatic:  23-Nov-15 17:22   Bilirubin, Total 0.3  Alkaline Phosphatase 113 (46-116 NOTE: New Reference Range 10/07/13)  SGPT (ALT) 41 (14-63 NOTE: New Reference Range 10/07/13)  SGOT (AST) 31  Total Protein, Serum  8.4  Albumin, Serum 4.0  Routine Chem:  23-Nov-15 17:22   Glucose, Serum 92  BUN 17  Creatinine (comp) 1.25  Sodium, Serum 139  Potassium, Serum 4.1  Chloride, Serum 104  CO2, Serum 28  Calcium (Total), Serum 9.5  Osmolality (calc) 279  eGFR (African American) >60  eGFR (Non-African American) >60 (eGFR values <50mL/min/1.73 m2 may be an indication of chronic kidney disease (CKD). Calculated eGFR, using the MRDR Study equation, is useful in  patients with stable renal function. The eGFR calculation will not be reliable in acutely ill patients when serum creatinine is changing rapidly. It is not useful in patients on dialysis. The eGFR calculation may not be applicable to patients at the low and high extremes of body sizes, pregnant women, and vegetarians.)  Anion Gap 7  Routine UA:  23-Nov-15 17:22   Color (UA) Yellow  Clarity (UA) Clear  Glucose (UA) Negative  Bilirubin (UA) Negative  Ketones (UA) Negative  Specific Gravity (UA) 1.024  Blood (UA) 3+  pH (UA) 6.0  Protein (UA) 30 mg/dL  Nitrite (UA) Negative  Leukocyte Esterase (UA) Negative (Result(s) reported on 09 Feb 2014 at 05:49PM.)  RBC (UA) 15 /HPF  WBC (UA) 2 /HPF  Bacteria (UA) NONE SEEN  Epithelial Cells (UA) NONE SEEN  Result(s) reported on 09 Feb 2014 at 05:49PM.  Routine Hem:  23-Nov-15 17:22   WBC (CBC)  22.4  RBC (CBC) 5.26  Hemoglobin (CBC) 15.4  Hematocrit (CBC) 44.0  Platelet Count (CBC)  523 (Result(s) reported on 09 Feb 2014 at 05:49PM.)  MCV 84  MCH 29.4  MCHC 35.1  RDW 13.7   Radiology Results:  Radiology Results: CT:    23-Nov-15 18:46, CT Abdomen Pelvis WO for Stone  CT Abdomen Pelvis WO for Stone  REASON FOR EXAM:    left flank  pain, hematuria  COMMENTS:       PROCEDURE: CT  - CT ABDOMEN /PELVIS WO (STONE)  - Feb 09 2014  6:46PM     CLINICAL DATA:  Left-sided flank pain    EXAM:  CT ABDOMEN AND PELVIS WITHOUT CONTRAST    TECHNIQUE:  Multidetector CT imaging of the abdomen and pelvis was performed  following the standard protocol without IV contrast.    COMPARISON:  None.  FINDINGS:  Lung bases are free of acute infiltrate or sizable effusion. The  liver, gallbladder, adrenal glands and pancreas are all normal in  their CT appearance. 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 The  appendix is within normal limits. The right kidney shows no renal  calculi or obstructive changes. The left kidney demonstrates mild  obstructive change secondary to a stone in the proximal left ureter  measuring 6 mm. This is best seen on image number 46 of series 2 the  more distal left ureter is within normal limits. The bladder is  partially distended. No pelvic mass lesion is seen. Symmetrical  inguinal lymph nodes are noted of uncertain significance. Scattered  small lymph nodes are noted in the retroperitoneum but not  significant by size criteria.     IMPRESSION:  Proximal to mid left ureteral stone with mild obstructive change.    Scattered inguinal lymph nodes bilaterally in a symmetrical fashion.  The majority of these have normal fatty hila. Short-term followup to  assess for stability would be helpful.      Electronically Signed    By: Inez Catalina M.D.    On: 02/09/2014 18:56         Verified By: Everlene Farrier, M.D.,    Aspirin Buffered: Unknown   General Aspect Left flank pain   Present Illness 39 yo F with acute onset left flank pain earlier today with assocated nausea/ vomiting presents to ED with poorly controlled pain, nausea, and vomiting. No voiding symptoms. CT scan shows 6 mm left proximal ureteral stone with mild hydronephrosis, WBC 22, Cr 1.25.  UA fairly unremarkable.  No fevers or chills,  hemodynamically stable.    Pain not adequately controlled with IV pain meds.  ROS- positive as per HPI, + cough, +runny nose, otherwise negative.  Vitals: 98, P73, 137/72, 95%on RA, pain 10/10   Case History and Physical Exam:  Chief Complaint Abdominal Pain  Nausea/Vomiting   Past Medical Health Hereditary spherocytosis   Past Surgical History Foot surgery, splenectomy, carpal tunnel release   Family History + FH kidney stones   HEENT PERLA  Moderate distress, uncomfortable appearing   Neck/Nodes Supple   Chest/Lungs Clear   Cardiovascular Normal Sinus Rhythm   Abdomen obese, splenectomy scar noted, left lower quadrant pain,, mild + left CVA tenderness   Genitalia WNL   Musculoskeletal Full range of motion   Neurological Grossly WNL   Skin WNL    Impression 39 yo F with 6 mm obstructing left proximal ureteral stone, leukocytosis to 22 and poorly controlled pain.  As such, recommend placement of left ureteral stent urgently.  He has been NPO since noon.  Discussed risks of the procedure including risk of bleeding, UTI, damage to surrounding structures, stent irritation, and need for definative stone management.   Plan -consent signed -site marked -IV levaquin on call to OR -likely will be discharged from PACU if remains HD stable   Electronic Signatures: Sherlynn Stalls (MD)  (Signed [phone] 20:58)  Authored: Significant Events - History, Home Medications, Labs, Radiology Results, Allergies, General Aspect/Present Illness, History and Physical Exam, Impression/Plan   Last Updated: 23-Nov-15 20:58 by Sherlynn Stalls (MD)

## 2014-08-18 ENCOUNTER — Ambulatory Visit (INDEPENDENT_AMBULATORY_CARE_PROVIDER_SITE_OTHER): Payer: Managed Care, Other (non HMO) | Admitting: Family Medicine

## 2014-08-18 ENCOUNTER — Encounter: Payer: Self-pay | Admitting: Family Medicine

## 2014-08-18 VITALS — BP 142/80 | HR 95 | Temp 98.6°F | Wt 332.8 lb

## 2014-08-18 DIAGNOSIS — J020 Streptococcal pharyngitis: Secondary | ICD-10-CM | POA: Diagnosis not present

## 2014-08-18 MED ORDER — LIDOCAINE VISCOUS 2 % MT SOLN: 10.0000 mL | OROMUCOSAL | Status: DC | PRN

## 2014-08-18 MED ORDER — AMOXICILLIN 875 MG PO TABS: 875.0000 mg | ORAL_TABLET | Freq: Two times a day (BID) | ORAL | Status: AC

## 2014-08-18 NOTE — Patient Instructions (Signed)
Use lidocaine if needed for throat pain.  Start amoxil today.  Drink plenty of fluids, take tylenol as needed, and gargle with warm salt water for your throat.  This should gradually improve.  Take care.  Let us know if you have other concerns.

## 2014-08-18 NOTE — Progress Notes (Signed)
Pre visit review using our clinic review tool, if applicable. No additional management support is needed unless otherwise documented below in the visit note.  Sx started about 2 days ago.  ST, rhinorrhea, stuffy nose, burning in the nose.  Yesterday with fatigue, didn't get out of bed.  Pain with swallowing.  Still fatigued.  Some cough, diarrhea yesterday.  No vomiting.  Likely had a fever.  Mild L sided ear pain.  No sick contacts.    Meds, vitals, and allergies reviewed.   ROS: See HPI.  Otherwise, noncontributory.  GEN: nad, alert and oriented HEENT: mucous membranes moist, tm w/o erythema, nasal exam w/o erythema, clear discharge noted,  OP with cobblestoning, erythema and B exudates NECK: supple w/ tender LA CV: rrr.   PULM: ctab, no inc wob EXT: no edema SKIN: no acute rash

## 2014-08-20 DIAGNOSIS — J020 Streptococcal pharyngitis: Secondary | ICD-10-CM | POA: Insufficient documentation

## 2014-08-20 NOTE — Assessment & Plan Note (Signed)
Treat, no need to test.  Use lidocaine if needed for throat pain. Start amoxil today.  See AVS re: supportive care.  D/w pt.

## 2014-09-30 ENCOUNTER — Encounter: Payer: Self-pay | Admitting: Family Medicine

## 2014-09-30 ENCOUNTER — Ambulatory Visit (INDEPENDENT_AMBULATORY_CARE_PROVIDER_SITE_OTHER)
Admission: RE | Admit: 2014-09-30 | Discharge: 2014-09-30 | Disposition: A | Discharge status: Home / Self Care | Payer: No Typology Code available for payment source | Source: Ambulatory Visit | Attending: Family Medicine | Admitting: Family Medicine

## 2014-09-30 ENCOUNTER — Ambulatory Visit (INDEPENDENT_AMBULATORY_CARE_PROVIDER_SITE_OTHER): Payer: No Typology Code available for payment source | Admitting: Family Medicine

## 2014-09-30 VITALS — BP 134/78 | HR 88 | Temp 98.6°F | Wt 340.0 lb

## 2014-09-30 DIAGNOSIS — M79672 Pain in left foot: Secondary | ICD-10-CM

## 2014-09-30 DIAGNOSIS — M79671 Pain in right foot: Secondary | ICD-10-CM | POA: Diagnosis not present

## 2014-09-30 DIAGNOSIS — M25579 Pain in unspecified ankle and joints of unspecified foot: Secondary | ICD-10-CM | POA: Diagnosis not present

## 2014-09-30 MED ORDER — MELOXICAM 15 MG PO TABS: 15.0000 mg | ORAL_TABLET | Freq: Every day | ORAL | Status: DC

## 2014-09-30 NOTE — Progress Notes (Signed)
Pre visit review using our clinic review tool, if applicable. No additional management support is needed unless otherwise documented below in the visit note.  Prev pain from L plantar fasciitis is improved but he is still having a different L foot pain. L medial heel and L medial ankle pain since the surgery.  Worse with walking/prolonged standing.  He has pain on the dorsal midfoot pain B that is worse after he has been sedentary.  Known OA in the feet from prev imaging.    No hand sx.  No FH RA.      Ibuprofen doesn't help.  Meloxicam helped some prev, along with new inserts, but the effect wore off.    D/w pt about weight, diet and exercise.   Meds, vitals, and allergies reviewed.   ROS: See HPI.  Otherwise, noncontributory.  nad ncat B hands w/o IP changes, no erythema or swelling.  B foot exam with loss of arch noted.  Normal DP pulses but slightly ttp on the dorsum of the feet in the proximal midfoot Tendon sheath posterior and inferior to L medial mal slightly ttp Med and lat mal not ttp B o/w.

## 2014-09-30 NOTE — Assessment & Plan Note (Signed)
Likely L medial tendonitis with B loss of arch and likely midfoot OA based on hx and exam.  Check plain films today, stop ibuprofen, start mobic and use soft arch support inserts.   Weight is the issue here- needs reduction with diet and exercise.  I checked with Dr. Lorelei Pont after the OV and he doesn't have the materials likely needed for more extensive inserts/orthotics. Would need to f/u with podiatry for that.   I don't think there is anything other than tendonitis and OA going on- no sign of RA, etc.

## 2014-09-30 NOTE — Patient Instructions (Signed)
Go to the lab on the way out.  We'll contact you with your xray report. Stop ibuprofen, change to meloxicam with food.  Get OTC soft arch supports in the meantime.  Try to work on your weight in the meantime.  Take care.  We'll be in touch.

## 2014-11-16 ENCOUNTER — Other Ambulatory Visit: Payer: Self-pay | Admitting: Family Medicine

## 2014-11-16 DIAGNOSIS — R739 Hyperglycemia, unspecified: Secondary | ICD-10-CM

## 2014-11-16 NOTE — Progress Notes (Signed)
Patient advised.  Lab appt scheduled.  

## 2014-11-16 NOTE — Progress Notes (Signed)
Call pt. Outside labs reviewed.  Elevated sugar noted, needs f/u A1c at lab visit.  Order is in.  Thanks.

## 2014-11-17 ENCOUNTER — Encounter: Payer: Self-pay | Admitting: Family Medicine

## 2014-11-17 ENCOUNTER — Other Ambulatory Visit (INDEPENDENT_AMBULATORY_CARE_PROVIDER_SITE_OTHER): Payer: No Typology Code available for payment source

## 2014-11-17 DIAGNOSIS — R739 Hyperglycemia, unspecified: Secondary | ICD-10-CM | POA: Diagnosis not present

## 2014-11-17 LAB — GLUCOSE (CC13)
CREATININE: 0.93
Cholesterol, Total: 180
GLUCOSE: 134
HDL: 32 mg/dL — AB (ref 35–70)
LDL (calc): 118
SGOT(AST): 22
SGPT (ALT): 31
Triglycerides: 149

## 2014-11-17 LAB — HEMOGLOBIN A1C: Hgb A1c MFr Bld: 5.6 % (ref 4.6–6.5)

## 2015-02-24 DIAGNOSIS — M722 Plantar fascial fibromatosis: Secondary | ICD-10-CM

## 2015-03-01 ENCOUNTER — Ambulatory Visit (INDEPENDENT_AMBULATORY_CARE_PROVIDER_SITE_OTHER): Payer: No Typology Code available for payment source | Admitting: Family Medicine

## 2015-03-01 ENCOUNTER — Encounter: Payer: Self-pay | Admitting: Family Medicine

## 2015-03-01 VITALS — BP 132/84 | HR 83 | Temp 98.5°F | Wt 362.0 lb

## 2015-03-01 DIAGNOSIS — R051 Acute cough: Secondary | ICD-10-CM | POA: Insufficient documentation

## 2015-03-01 DIAGNOSIS — R059 Cough, unspecified: Secondary | ICD-10-CM

## 2015-03-01 DIAGNOSIS — R05 Cough: Secondary | ICD-10-CM

## 2015-03-01 MED ORDER — AZITHROMYCIN 250 MG PO TABS
ORAL_TABLET | ORAL | Status: DC
Start: 2015-03-01 — End: 2015-05-11

## 2015-03-01 MED ORDER — ALBUTEROL SULFATE HFA 108 (90 BASE) MCG/ACT IN AERS: 2.0000 | INHALATION_SPRAY | Freq: Four times a day (QID) | RESPIRATORY_TRACT | Status: DC | PRN

## 2015-03-01 NOTE — Progress Notes (Signed)
Pre visit review using our clinic review tool, if applicable. No additional management support is needed unless otherwise documented below in the visit note.  Cough for about 2-3 weeks. Taking mucinex and OTC meds.  No fevers.  No ear pain.  Some rhinorrhea, some HA (frontal and temporal).  No ST.  Persistent cough, in fits.  Gagging from coughing.  Rare sputum, discolored.  Some wheeze. No diarrhea.  Can get a good deep breath.    Meds, vitals, and allergies reviewed.   ROS: See HPI.  Otherwise, noncontributory.  GEN: nad, alert and oriented HEENT: mucous membranes moist, tm w/o erythema, nasal exam w/o erythema, clear discharge noted,  OP with cobblestoning NECK: supple w/o LA CV: rrr.   PULM: coarse BS with diffuse rhonchi, scant B wheeze, no focal dec in BS, no inc wob EXT: no edema SKIN: no acute rash

## 2015-03-01 NOTE — Patient Instructions (Signed)
Start the antibiotics, use the inhaler if needed and try to get some rest.   Take care.  Glad to see you.

## 2015-03-01 NOTE — Assessment & Plan Note (Signed)
Concern for atypical process vs bronchitis.  Would treat in meantime. Nontoxic.  Start zmax, use SABA if needed and he'll try to get some rest.  F/u prn.  He agrees.

## 2015-03-05 ENCOUNTER — Telehealth: Payer: Self-pay

## 2015-03-05 NOTE — Telephone Encounter (Signed)
Patient advised.  Patient says inhaler helps at night but he hasn't been using it during the day.  Patient says he is not as concerned about the cough as the congestion in his chest that doesn't want to move.

## 2015-03-05 NOTE — Telephone Encounter (Signed)
I would use the inhaler specifically for that and update [place] if not better in a few days.  Thanks.

## 2015-03-05 NOTE — Telephone Encounter (Signed)
Does the inhaler help? It is correct about the timeline on zmax- I wouldn't repeat it yet.  If the inhaler isn't helping then we need to do something else about the cough.  Glad he is some better.  Thanks.

## 2015-03-05 NOTE — Telephone Encounter (Signed)
Patient advised.

## 2015-03-05 NOTE — Telephone Encounter (Signed)
pts wife,Kaylie left v/m(do not see DPR); pt seen 03/01/15;pt will finish Zpak today; pt request another abx to get rid of all congestion. Unable to contact any one by phone to let them know that Zpak 5 day course of med is effective for 10 days. Please advise.[facility] [address]

## 2015-03-05 NOTE — Telephone Encounter (Signed)
Pt said he has chest congestion;non prod cough;wheezing in chest all the time. On and off upon exertion has SOB. No fever.Pt does not want to come in for another appt; pt said slightly better than when seen on 03/01/15. Pt requesting another abx or refill of Zpak; explained that zpak 5 day course is effective for 10 days; pt is not taking any other med for cough or congestion. Pt request cb.[facility] [address]

## 2015-05-11 ENCOUNTER — Encounter: Payer: Self-pay | Admitting: Family Medicine

## 2015-05-11 ENCOUNTER — Ambulatory Visit (INDEPENDENT_AMBULATORY_CARE_PROVIDER_SITE_OTHER): Payer: BLUE CROSS/BLUE SHIELD | Admitting: Family Medicine

## 2015-05-11 VITALS — BP 128/84 | HR 93 | Temp 98.5°F | Wt 373.0 lb

## 2015-05-11 DIAGNOSIS — J069 Acute upper respiratory infection, unspecified: Secondary | ICD-10-CM | POA: Diagnosis not present

## 2015-05-11 MED ORDER — AMOXICILLIN-POT CLAVULANATE 875-125 MG PO TABS: 1.0000 | ORAL_TABLET | Freq: Two times a day (BID) | ORAL | Status: DC

## 2015-05-11 NOTE — Patient Instructions (Signed)
Start augmentin and try to get some rest.  Take care.  Glad to see you.

## 2015-05-11 NOTE — Progress Notes (Signed)
Pre visit review using our clinic review tool, if applicable. No additional management support is needed unless otherwise documented below in the visit note.  Started with sx about 4 days ago.  Nasal burning and itching.  No fevers.  Post nasal gtt.  Cough.  Voice is altered.  Minimal ST.  Some sputum.   No ear pain.    Meds, vitals, and allergies reviewed.   ROS: See HPI.  Otherwise, noncontributory.  GEN: nad, alert and oriented HEENT: mucous membranes moist, tm w/o erythema, nasal exam w/o erythema but diffuse irritation, discolored discharge noted,  OP with cobblestoning NECK: supple w/o LA CV: rrr.   PULM: ctab, no inc wob EXT: no edema SKIN: no acute rash

## 2015-05-12 DIAGNOSIS — J069 Acute upper respiratory infection, unspecified: Secondary | ICD-10-CM | POA: Insufficient documentation

## 2015-05-12 NOTE — Assessment & Plan Note (Signed)
Likely not flu, given the lack of fever, d/w pt.   Given his hx prev, would treat for presumed sinusitis.  Start augmentin and f/u prn.  D/w pt. He agrees.  See AVS.

## 2015-12-09 ENCOUNTER — Encounter: Payer: Self-pay | Admitting: Family Medicine

## 2015-12-09 ENCOUNTER — Encounter: Payer: Self-pay | Admitting: Internal Medicine

## 2015-12-09 ENCOUNTER — Ambulatory Visit (INDEPENDENT_AMBULATORY_CARE_PROVIDER_SITE_OTHER): Payer: BLUE CROSS/BLUE SHIELD | Admitting: Family Medicine

## 2015-12-09 ENCOUNTER — Ambulatory Visit (INDEPENDENT_AMBULATORY_CARE_PROVIDER_SITE_OTHER): Payer: BLUE CROSS/BLUE SHIELD | Admitting: Internal Medicine

## 2015-12-09 ENCOUNTER — Other Ambulatory Visit: Payer: Self-pay | Admitting: Family Medicine

## 2015-12-09 VITALS — BP 132/76 | HR 97 | Temp 98.5°F | Wt 381.5 lb

## 2015-12-09 VITALS — BP 148/90 | HR 94 | Ht 72.0 in | Wt 380.0 lb

## 2015-12-09 DIAGNOSIS — R739 Hyperglycemia, unspecified: Secondary | ICD-10-CM

## 2015-12-09 DIAGNOSIS — R351 Nocturia: Secondary | ICD-10-CM | POA: Insufficient documentation

## 2015-12-09 DIAGNOSIS — R0683 Snoring: Secondary | ICD-10-CM | POA: Diagnosis not present

## 2015-12-09 DIAGNOSIS — Z9989 Dependence on other enabling machines and devices: Secondary | ICD-10-CM | POA: Insufficient documentation

## 2015-12-09 DIAGNOSIS — G4719 Other hypersomnia: Secondary | ICD-10-CM | POA: Diagnosis not present

## 2015-12-09 DIAGNOSIS — G4733 Obstructive sleep apnea (adult) (pediatric): Secondary | ICD-10-CM | POA: Insufficient documentation

## 2015-12-09 LAB — BASIC METABOLIC PANEL
BUN: 13 mg/dL (ref 6–23)
CALCIUM: 8.9 mg/dL (ref 8.4–10.5)
CO2: 32 mEq/L (ref 19–32)
CREATININE: 0.87 mg/dL (ref 0.40–1.50)
Chloride: 100 mEq/L (ref 96–112)
GFR: 102.9 mL/min (ref >60.00)
Glucose, Bld: 163 mg/dL — ABNORMAL HIGH (ref 70–99)
Potassium: 4.4 mEq/L (ref 3.5–5.1)
SODIUM: 135 meq/L (ref 135–145)

## 2015-12-09 NOTE — Progress Notes (Signed)
[name]      Assessment and Plan:  Excessive daytime sleepiness --Symptoms and signs of OSA, will send for sleep study.   Obesity --BMI 51.  --Weight loss would be beneficial for his health.  --Discussed weight loss, he has started going to the gym again and has started losing weight again.   Date: 12/09/2015  MRN# [id number] [name] 12-29-1975  Referring Physician:   Orey [name] is a 40 y.o. old male seen in [name] for chief complaint of:    Chief Complaint  Patient presents with  . Advice Only    sleep consult, ref by Dr. Damita Dunnings; apnea, snoring, excessive daytime sleepiness, HA    HPI:   The patient is a 40 yo male with daytime sleepiness, his wife has noted loud snoring and apneic episodes. His wife has noticed that he has been snoring, he has been snoring most of his life, but she notices that he stops breathing in his sleep. This has coincided that with him feeling more tired/sleepy during the day.  He usually gets up 7am and does not feel rested. 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 He goes to bed at 10 to MN, he does not watch television in bed, he falls asleep fairly quickly.   He does not sleep walk, he dreams a lot, he has no sleep attacks though he will fall asleep easily if he sits quietly. He has trouble staying asleep for long drives. He also notes problems with memory and concentration.   He quit smoking about 3 years ago and has gained 80 lbs.    PMHX:   Past Medical History:  Diagnosis Date  . Allergy   . Anemia    hereditary spherocytosis, resolved after splenectomy in childhood per patient   Surgical Hx:  Past Surgical History:  Procedure Laterality Date  . CARPAL TUNNEL RELEASE     bilateral  . PLANTAR FASCIA SURGERY Left   . SPLENECTOMY, TOTAL     Family Hx:  Family History  Problem Relation Age of Onset  . Arthritis Mother   . Diabetes Mother   . Hypertension Mother   .  Hyperlipidemia Mother   . Heart disease Mother   . Arthritis Father   . Diabetes Father   . Anemia Maternal Grandmother    Social Hx:   Social History  Substance Use Topics  . Smoking status: Former Smoker    Packs/day: 1.00    Years: 17.00    Types: Cigarettes  . Smokeless tobacco: Current User    Types: Snuff     Comment: since 02/2013  . Alcohol use No   Medication:    Reviewed   Allergies:  Aspirin  Review of Systems: Gen:  Denies  fever, sweats, chills HEENT: Denies blurred vision, double vision. Cvc:  No dizziness, chest pain. Resp:   Denies cough or sputum production, shortness of breath Gi: Denies swallowing difficulty, stomach pain. Gu:  Denies bladder incontinence, burning urine Ext:   No Joint pain, stiffness. Skin: No skin rash,  hives  Endoc:  No polyuria, polydipsia. Psych: No depression, insomnia. Other:  All other systems were reviewed with the patient and were negative other that what is mentioned in the HPI.   Physical Examination:   VS: BP (!) 148/90 (BP Location: Left Arm, Cuff Size: Normal)   Pulse 94   Ht 6' (1.829 m)   Wt (!) 380 lb (172.4 kg)   SpO2 98%  BMI 51.54 kg/m   General Appearance: No distress  Neuro:without focal findings,  speech normal,  HEENT: PERRLA, EOM intact.  Malimpatti 3.  [name]: normal breath sounds, No wheezing.  CardiovascularNormal S1,S2.  No m/r/g.   Abdomen: Benign, Soft, non-tender. Renal:  No costovertebral tenderness  GU:  No performed at this time. Endoc: No evident thyromegaly, no signs of acromegaly. Skin:   warm, no rashes, no ecchymosis  Extremities: normal, no cyanosis, clubbing.  Other findings:    LABORATORY PANEL:   CBC No results for input(s): WBC, HGB, HCT, PLT in the last 168 hours. ------------------------------------------------------------------------------------------------------------------  Chemistries  No results for input(s): NA, K, CL, CO2, GLUCOSE, BUN, CREATININE,  CALCIUM, MG, AST, ALT, ALKPHOS, BILITOT in the last 168 hours.  Invalid input(s): GFRCGP ------------------------------------------------------------------------------------------------------------------  Cardiac Enzymes No results for input(s): TROPONINI in the last 168 hours. ------------------------------------------------------------  RADIOLOGY:  No results found.     Thank  you for the [name] and for allowing [name] to assist in the care of your patient. Our recommendations are noted above.  Please contact us if we can be of further service.   Stephen Stalker, MD.  Board Certified in Internal Medicine, [name] Medicine, Pink, and Sleep Medicine.  [name] Office Number: [phone]  [name], M.D.  [name], M.D.  [name], M.D  12/09/2015

## 2015-12-09 NOTE — Patient Instructions (Signed)
[name] will call about your referral. Go to the lab on the way out.  We'll contact you with your lab report. Take care.  Glad to see you.  Keep working on your weight in the meantime.

## 2015-12-09 NOTE — Assessment & Plan Note (Signed)
Check BMET given the nocturia.  FH DM2 noted.  D/w pt. He agrees.

## 2015-12-09 NOTE — Patient Instructions (Signed)
    Sleep Apnea Sleep apnea is disorder that affects a person's sleep. A person with sleep apnea has abnormal pauses in their breathing when they sleep. It is hard for them to get a good sleep. This makes a person tired during the day. It also can lead to other physical problems. There are three types of sleep apnea. One type is when breathing stops for a short time because your airway is blocked (obstructive sleep apnea). Another type is when the brain sometimes fails to give the normal signal to breathe to the muscles that control your breathing (central sleep apnea). The third type is a combination of the other two types. HOME CARE   Take all medicine as told by your doctor.  Avoid alcohol, calming medicines (sedatives), and depressant drugs.  Try to lose weight if you are overweight. Talk to your doctor about a healthy weight goal.  Your doctor may have you use a device that helps to open your airway. It can help you get the air that you need. It is called a positive airway pressure (PAP) device.   MAKE SURE YOU:   Understand these instructions.  Will watch your condition.  Will get help right away if you are not doing well or get worse.  It may take approximately 1 month for you to get used to wearing her CPAP every night.

## 2015-12-09 NOTE — Progress Notes (Signed)
Pre visit review using our clinic review tool, if applicable. No additional management support is needed unless otherwise documented below in the visit note. 

## 2015-12-09 NOTE — Progress Notes (Signed)
His mother and grandfather died this year.    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    Sleep troubles.  Getting up at night to urinate, 4-5 times per night.  Wife recorded him and he is snoring a lot, with apnea noted by wife.  Chest is sore in the AM, from snoring.  Fatigued during the day.  AM headaches.  Weight is up, d/w pt.  D/w pt about OSA path/phys.    Meds, vitals, and allergies reviewed.   ROS: Per HPI unless specifically indicated in ROS section   GEN: nad, alert and oriented HEENT: mucous membranes moist NECK: supple w/o LA CV: rrr. PULM: ctab, no inc wob ABD: soft, +bs EXT: no edema  Prev down to 207 lbs as an adult.  Has prev lost 100lbs twice in his life.  Recently started back at the gym.    21" neck.

## 2015-12-09 NOTE — Assessment & Plan Note (Signed)
D/w pt about diet and exercise.  See above.   Likely OSA.  Refer for testing.  Path/phys d/w pt.  He agrees.

## 2015-12-13 ENCOUNTER — Other Ambulatory Visit (INDEPENDENT_AMBULATORY_CARE_PROVIDER_SITE_OTHER): Payer: BLUE CROSS/BLUE SHIELD

## 2015-12-13 DIAGNOSIS — R739 Hyperglycemia, unspecified: Secondary | ICD-10-CM

## 2015-12-13 LAB — HEMOGLOBIN A1C: HEMOGLOBIN A1C: 7.5 % — AB (ref 4.6–6.5)

## 2015-12-14 ENCOUNTER — Encounter: Payer: Self-pay | Admitting: Family Medicine

## 2015-12-29 ENCOUNTER — Ambulatory Visit: Payer: BLUE CROSS/BLUE SHIELD | Attending: Internal Medicine

## 2015-12-29 DIAGNOSIS — G4719 Other hypersomnia: Secondary | ICD-10-CM

## 2015-12-29 DIAGNOSIS — G4733 Obstructive sleep apnea (adult) (pediatric): Secondary | ICD-10-CM | POA: Insufficient documentation

## 2016-01-03 ENCOUNTER — Telehealth: Payer: Self-pay | Admitting: *Deleted

## 2016-01-03 DIAGNOSIS — G4733 Obstructive sleep apnea (adult) (pediatric): Secondary | ICD-10-CM

## 2016-01-03 NOTE — Telephone Encounter (Signed)
-----   Message from Laverle Hobby, MD sent at 12/31/2015  3:32 PM EDT ----- Regarding: sleep study reviewed Positive split night, severe OSA. Recommend  Auto-titrating CPAP with pressure range of 10-20 to help patient better acclimatize to CPAP

## 2016-01-03 NOTE — Telephone Encounter (Signed)
Pt informed of sleep study results. Order for CPAP placed. Nothing further needed.

## 2016-01-18 NOTE — Telephone Encounter (Signed)
[name],  Order was placed for CPAP machine on 01/03/16 for [facility]. I received a message stating pt has not heard from [facility]. Will you f/u with them please? Thanks.

## 2016-01-18 NOTE — Telephone Encounter (Addendum)
Received a call from the patient's wife stating they have not heard from anyone in regards to his CPAP machine. Can you follow-up with them please. Please call him [phone] or Tammy [phone]

## 2016-01-20 ENCOUNTER — Telehealth: Payer: Self-pay | Admitting: Internal Medicine

## 2016-01-20 NOTE — Telephone Encounter (Signed)
Pt is calling because he hasn't heard from [facility]. Order for CPAP was done 01/03/16 for a CPAP machine. Please advise if we can get an update. Thanks.

## 2016-01-20 NOTE — Telephone Encounter (Signed)
Spoke with patient's wife, [name] and she stated that [facility] called and set patient up with an appointment for Tues 01/24/16 at the [facility].  Pt has been contacted and CPAP Set up arranged. Nothing else needed at this time. Advised pt's wife that if they had any additional concerns to give [place] a call. Wife stated that she appreciated the follow up call and thanked me for my time. Nothing else needed at this time. Rhonda J Cobb

## 2016-01-20 NOTE — Telephone Encounter (Signed)
Pt spouse called, states they have not heard anything regarding pt sleep machine. States pt is still not sleeping at night. Please call.

## 2016-01-20 NOTE — Telephone Encounter (Signed)
Called and spoke with Jiles Crocker at [facility] and he stated that [facility] has everything that they need to process order. Jason P to send e-mail to [facility] to have them to contact patient to schedule set up appointment today.  Jiles Crocker to expedite order so patient can get set up with an appointment. Carlos Levering Cobb

## 2016-02-03 ENCOUNTER — Ambulatory Visit: Payer: BLUE CROSS/BLUE SHIELD | Admitting: Family Medicine

## 2016-02-07 ENCOUNTER — Ambulatory Visit (INDEPENDENT_AMBULATORY_CARE_PROVIDER_SITE_OTHER): Payer: BLUE CROSS/BLUE SHIELD | Admitting: Family Medicine

## 2016-02-07 ENCOUNTER — Telehealth: Payer: Self-pay | Admitting: Family Medicine

## 2016-02-07 ENCOUNTER — Encounter: Payer: Self-pay | Admitting: Family Medicine

## 2016-02-07 VITALS — BP 126/86 | HR 94 | Temp 98.3°F | Wt 385.0 lb

## 2016-02-07 DIAGNOSIS — F410 Panic disorder [episodic paroxysmal anxiety] without agoraphobia: Secondary | ICD-10-CM

## 2016-02-07 DIAGNOSIS — R030 Elevated blood-pressure reading, without diagnosis of hypertension: Secondary | ICD-10-CM | POA: Diagnosis not present

## 2016-02-07 DIAGNOSIS — F419 Anxiety disorder, unspecified: Secondary | ICD-10-CM | POA: Insufficient documentation

## 2016-02-07 DIAGNOSIS — I10 Essential (primary) hypertension: Secondary | ICD-10-CM | POA: Insufficient documentation

## 2016-02-07 MED ORDER — ESCITALOPRAM OXALATE 10 MG PO TABS: 10.0000 mg | ORAL_TABLET | Freq: Every day | ORAL | 1 refills | Status: DC

## 2016-02-07 NOTE — Assessment & Plan Note (Signed)
See discussion regarding panic. He has normal blood pressure now. Likely that he has panic causing episodic blood pressure elevation. Okay for outpatient follow-up. He can follows blood pressure outside of the clinic

## 2016-02-07 NOTE — Progress Notes (Signed)
Mother and paternal grandfather died this year.  Condolences offered.  This has been difficult for him.  D/w pt.  He is trying to work through the changes- this is the first set of holidays w/o his mother.  No SI/HI.    He has been on CPAP, sleeping better.  Less nocturia.    BP elevation prev.  Up to systolic [id number].  Normal here today.  Panic sx- he admits to getting worried.  Sister with h/o panic, treated with lexapro.  Mother had h/o panic.  He gets emotional talking about his family.    No CP.  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    Meds, vitals, and allergies reviewed.   ROS: Per HPI unless specifically indicated in ROS section   GEN: nad, alert and oriented HEENT: mucous membranes moist NECK: supple w/o LA CV: rrr.  PULM: ctab, no inc wob ABD: soft, +bs EXT: Trace BLE edema Speech and affect within normal limits. Judgment within normal limits. No suicidal or homicidal intent.

## 2016-02-07 NOTE — Assessment & Plan Note (Signed)
Outside EKG reviewed. Unremarkable. No chest pain. 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 He is likely overall deconditioned from a cardiovascular standpoint but I don't think he has ominous cardiovascular disease causing his symptoms. More likely to be related to panic. Discussed with patient. He agrees. Okay to start Lexapro 10 mg a day. He can start that and update me. Okay for outpatient follow-up.

## 2016-02-07 NOTE — Telephone Encounter (Signed)
Does he need to come in today?  Okay to work in if needed/if he can come in.  Thanks.

## 2016-02-07 NOTE — Telephone Encounter (Signed)
Patient Name: [name] DOB: 05/08/1975 Initial Comment Caller says, has high blood pressure appt, Sx feeling flush, pressure in eyes , hot flash feeling, -- no blood pressure cuff -- Nurse Assessment Nurse: Wynetta Emery, RN, Baker Janus Date/Time (Eastern Time): 02/07/2016 9:53:49 AM Confirm and document reason for call. If symptomatic, describe symptoms. You must click the next button to save text entered. 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 ---No Does the patient have any new or worsening symptoms? ---Yes Will a triage be completed? ---Yes Related visit to physician within the last 2 weeks? ---No Does the PT have any chronic conditions? (i.e. diabetes, asthma, etc.) ---Yes List chronic conditions. ---Sleep Apnea Is this a behavioral health or substance abuse call? ---No Guidelines Guideline Title Affirmed Question Affirmed Notes High Blood Pressure [1] BP # 140/90 AND [2] not taking BP medications Final Disposition User See Physician within Ronald, RN, Viola advised Hiram Comber unable to get into [facility] and can we have appt for Shanon Brow with Dr. Damita Dunnings -- schedule for for today but has 1130am available for 11/21-2017 -- advised [place] will take appt for 1130am [place] concurred and will be there tomorrow for appt. Referrals REFERRED TO PCP OFFICE Disagree/Comply: Comply

## 2016-02-07 NOTE — Patient Instructions (Signed)
It is reasonable to either go to counseling and/or start lexapro.  I think panic is likely contributing.  Keep using cpap in the meantime.  Take care.  Glad to see you.

## 2016-02-07 NOTE — Progress Notes (Signed)
Pre visit review using our clinic review tool, if applicable. No additional management support is needed unless otherwise documented below in the visit note. 

## 2016-02-07 NOTE — Telephone Encounter (Signed)
Scheduled appt for today

## 2016-02-07 NOTE — Telephone Encounter (Signed)
Pt has appt on 02/08/16 at 11:30 with Dr Damita Dunnings.

## 2016-02-08 ENCOUNTER — Ambulatory Visit: Payer: BLUE CROSS/BLUE SHIELD | Admitting: Family Medicine

## 2016-02-22 ENCOUNTER — Encounter: Payer: Self-pay | Admitting: Internal Medicine

## 2016-03-05 NOTE — Progress Notes (Signed)
[name] Consultation      Assessment and Plan:  Obstructive sleep apnea. --Review of download data shows 95th percentile pressure of 19, the floor appears to be about 15. -We'll change AutoPap pressures to a range of 14-22.  Excessive daytime sleepiness.  --Doing much better, however is only getting about 5.5 hours per night, recommend increasing amount of sleep.   Obesity --BMI 51.  --Weight loss would be beneficial for his health.  --Discussed weight loss, he has started going to the gym again and has started losing weight again.   Date: 03/05/2016  MRN# [id number] [name] [birth year]  Referring Physician:   Yulian [name] is a 40 y.o. old male seen in consultation for chief complaint of:    Chief Complaint  Patient presents with  . Follow-up    48mo rov. pt wearing cpap avg 5-6hr nightly, feels pressure & mask are okay. pt waking feeling well rested. DME:[facility]    HPI:   The patient is a 40 yo male with daytime sleepiness, his wife has noted loud snoring and apneic episodes. He was sent for a sleep study which was positive for OSA, he was sent for a split night study.  He has been wearing the CPAP every night, and notes that he is much more awake during the day, he feels much more rested, wakes feeling much more rested in the am and during the day.  He notes that he is going to bed late because he stays up too late watching television, goes to bed from 11 to 2.   Sleep study review on 12/29/15;  Positive for OSA, ideal pressure was 16 but not well tolerated, therefore recommend a pressure of 10-20.CPAP floor Appears to be about 14-16.  Reviewed download data, 80% complaints, pressure 10-20. Median pressures 16, 95th percentile is 19.3, residual AHI is 2.1.   Medication:    Reviewed   Allergies:  Aspirin  Review of Systems: Gen:  Denies  fever, sweats, chills HEENT: Denies blurred vision, double vision. Cvc:  No dizziness, chest  pain. Resp:   Denies cough or sputum production, shortness of breath Gi: Denies swallowing difficulty, stomach pain. Gu:  Denies bladder incontinence, burning urine Ext:   No Joint pain, stiffness. Skin: No skin rash,  hives  Endoc:  No polyuria, polydipsia. Psych: No depression, insomnia. Other:  All other systems were reviewed with the patient and were negative other that what is mentioned in the HPI.   Physical Examination:   VS: BP 132/76 (BP Location: Left Arm, Cuff Size: Normal)   Pulse 89   Ht 6' (1.829 m)   Wt (!) 172.8 kg (381 lb)   SpO2 95%   BMI 51.67 kg/m   General Appearance: No distress  Neuro:without focal findings,  speech normal,  HEENT: PERRLA, EOM intact.  Malimpatti 3.  Pulmonary: normal breath sounds, No wheezing.  CardiovascularNormal S1,S2.  No m/r/g.   Abdomen: Benign, Soft, non-tender. Renal:  No costovertebral tenderness  GU:  No performed at this time. Endoc: No evident thyromegaly, no signs of acromegaly. Skin:   warm, no rashes, no ecchymosis  Extremities: normal, no cyanosis, clubbing.  Other findings:    LABORATORY PANEL:   CBC No results for input(s): WBC, HGB, HCT, PLT in the last 168 hours. ------------------------------------------------------------------------------------------------------------------  Chemistries  No results for input(s): NA, K, CL, CO2, GLUCOSE, BUN, CREATININE, CALCIUM, MG, AST, ALT, ALKPHOS, BILITOT in the last 168 hours.  Invalid input(s): GFRCGP ------------------------------------------------------------------------------------------------------------------  Cardiac  Enzymes No results for input(s): TROPONINI in the last 168 hours. ------------------------------------------------------------  RADIOLOGY:  No results found.     Thank  you for the consultation and for allowing [facility] to assist in the care of your patient. Our recommendations are noted above.  Please contact  us if we can be of further service.   Stephen Stalker, MD.  Board Certified in Internal [name], Pulmonary [name], Man, and Sleep [name].  [facility] Office Number: [phone]  [name], M.D.  [name], M.D.  [name], M.D  03/05/2016

## 2016-03-06 ENCOUNTER — Encounter: Payer: Self-pay | Admitting: Internal Medicine

## 2016-03-07 ENCOUNTER — Ambulatory Visit (INDEPENDENT_AMBULATORY_CARE_PROVIDER_SITE_OTHER): Payer: BLUE CROSS/BLUE SHIELD | Admitting: Internal Medicine

## 2016-03-07 ENCOUNTER — Encounter: Payer: Self-pay | Admitting: Internal Medicine

## 2016-03-07 VITALS — BP 132/76 | HR 89 | Ht 72.0 in | Wt 381.0 lb

## 2016-03-07 DIAGNOSIS — G4733 Obstructive sleep apnea (adult) (pediatric): Secondary | ICD-10-CM | POA: Diagnosis not present

## 2016-03-07 NOTE — Addendum Note (Signed)
Addended by: Maryanna Shape A on: 03/07/2016 04:48 PM   Modules accepted: Orders

## 2016-03-07 NOTE — Patient Instructions (Signed)
change AutoPap pressures to a range of 14-22.  --try to increase your amount of sleep every night, at least 6 hours.

## 2016-03-07 NOTE — Addendum Note (Signed)
Addended by: Maryanna Shape A on: 03/07/2016 10:18 AM   Modules accepted: Orders

## 2016-05-12 DIAGNOSIS — G4733 Obstructive sleep apnea (adult) (pediatric): Secondary | ICD-10-CM | POA: Diagnosis not present

## 2016-05-24 DIAGNOSIS — G4733 Obstructive sleep apnea (adult) (pediatric): Secondary | ICD-10-CM | POA: Diagnosis not present

## 2016-06-24 DIAGNOSIS — G4733 Obstructive sleep apnea (adult) (pediatric): Secondary | ICD-10-CM | POA: Diagnosis not present

## 2016-07-24 DIAGNOSIS — G4733 Obstructive sleep apnea (adult) (pediatric): Secondary | ICD-10-CM | POA: Diagnosis not present

## 2016-08-01 ENCOUNTER — Telehealth: Payer: Self-pay | Admitting: Family Medicine

## 2016-08-01 NOTE — Telephone Encounter (Signed)
Pt has appt with Dr Damita Dunnings 08/02/16 at [facility].

## 2016-08-01 NOTE — Telephone Encounter (Signed)
[facility]  Patient Name: [name]  DOB: [birth year]    Initial Comment caller states he is unsure if he having anxiety or something else . States his chest is hurting an feels tight .    Nurse Assessment  Nurse: Harlow Mares, RN, Suanne Marker Date/Time (Eastern Time): 08/01/2016 10:59:19 AM  Confirm and document reason for call. If symptomatic, describe symptoms. ---caller states he is unsure if he having anxiety or something else . States his chest is hurting an feels tight. Is not constant, comes and goes. Was doing something a few mins ago and felt it. Is not currently having chest pain. Denies SOB. Reports left arm discomfort and tingling sensation at times. Shoulder discomfort at times also.  Does the patient have any new or worsening symptoms? ---Yes  Will a triage be completed? ---Yes  Related visit to physician within the last 2 weeks? ---No  Does the PT have any chronic conditions? (i.e. diabetes, asthma, etc.) ---No  Is this a behavioral health or substance abuse call? ---No     Guidelines    Guideline Title Affirmed Question Affirmed Notes  Chest Pain [1] Chest pain lasting <= 5 minutes AND [2] NO chest pain or cardiac symptoms now (Exceptions: pains lasting a few seconds)    Final Disposition User   See Physician within 24 Hours Harlow Mares, RN, Suanne Marker    Comments  Appt scheduled for tomorrow 08/02/16 at 2p with Dr. Damita Dunnings at the [facility] location. Caller voiced understanding.   Referrals  REFERRED TO PCP OFFICE   Disagree/Comply: Comply

## 2016-08-02 ENCOUNTER — Encounter: Payer: Self-pay | Admitting: Family Medicine

## 2016-08-02 ENCOUNTER — Telehealth: Payer: Self-pay | Admitting: Radiology

## 2016-08-02 ENCOUNTER — Ambulatory Visit (INDEPENDENT_AMBULATORY_CARE_PROVIDER_SITE_OTHER): Payer: BLUE CROSS/BLUE SHIELD | Admitting: Family Medicine

## 2016-08-02 VITALS — BP 130/78 | HR 84 | Temp 98.0°F | Wt 347.5 lb

## 2016-08-02 DIAGNOSIS — E119 Type 2 diabetes mellitus without complications: Secondary | ICD-10-CM | POA: Diagnosis not present

## 2016-08-02 DIAGNOSIS — E669 Obesity, unspecified: Secondary | ICD-10-CM

## 2016-08-02 DIAGNOSIS — Z9989 Dependence on other enabling machines and devices: Secondary | ICD-10-CM | POA: Diagnosis not present

## 2016-08-02 DIAGNOSIS — G4733 Obstructive sleep apnea (adult) (pediatric): Secondary | ICD-10-CM

## 2016-08-02 DIAGNOSIS — F410 Panic disorder [episodic paroxysmal anxiety] without agoraphobia: Secondary | ICD-10-CM | POA: Diagnosis not present

## 2016-08-02 DIAGNOSIS — D72829 Elevated white blood cell count, unspecified: Secondary | ICD-10-CM

## 2016-08-02 DIAGNOSIS — Z8639 Personal history of other endocrine, nutritional and metabolic disease: Secondary | ICD-10-CM | POA: Diagnosis not present

## 2016-08-02 LAB — COMPREHENSIVE METABOLIC PANEL
ALK PHOS: 81 U/L (ref 39–117)
ALT: 26 U/L (ref 0–53)
AST: 22 U/L (ref 0–37)
Albumin: 4.1 g/dL (ref 3.5–5.2)
BUN: 14 mg/dL (ref 6–23)
CALCIUM: 9.9 mg/dL (ref 8.4–10.5)
CO2: 31 mEq/L (ref 19–32)
Chloride: 99 mEq/L (ref 96–112)
Creatinine, Ser: 0.86 mg/dL (ref 0.40–1.50)
GFR: 103.94 mL/min (ref >60.00)
GLUCOSE: 155 mg/dL — AB (ref 70–99)
POTASSIUM: 4 meq/L (ref 3.5–5.1)
Sodium: 135 mEq/L (ref 135–145)
TOTAL PROTEIN: 7.8 g/dL (ref 6.0–8.3)
Total Bilirubin: 0.4 mg/dL (ref 0.2–1.2)

## 2016-08-02 LAB — CBC WITH DIFFERENTIAL/PLATELET
BASOS ABS: 0.2 10*3/uL — AB (ref 0.0–0.1)
BASOS PCT: 1.1 % (ref 0.0–3.0)
EOS ABS: 0.3 10*3/uL (ref 0.0–0.7)
Eosinophils Relative: 1.6 % (ref 0.0–5.0)
HCT: 41.9 % (ref 39.0–52.0)
Hemoglobin: 14.2 g/dL (ref 13.0–17.0)
LYMPHS ABS: 5.2 10*3/uL — AB (ref 0.7–4.0)
Lymphocytes Relative: 24.9 % (ref 12.0–46.0)
MCHC: 33.9 g/dL (ref 30.0–36.0)
MCV: 81.1 fl (ref 78.0–100.0)
MONO ABS: 1.5 10*3/uL — AB (ref 0.1–1.0)
Monocytes Relative: 7 % (ref 3.0–12.0)
NEUTROS ABS: 13.6 10*3/uL — AB (ref 1.4–7.7)
NEUTROS PCT: 65.4 % (ref 43.0–77.0)
PLATELETS: 568 10*3/uL — AB (ref 150.0–400.0)
RBC: 5.17 Mil/uL (ref 4.22–5.81)
RDW: 13.8 % (ref 11.5–15.5)
WBC: 20.8 10*3/uL (ref 4.0–10.5)

## 2016-08-02 LAB — TSH: TSH: 1.19 u[IU]/mL (ref 0.35–4.50)

## 2016-08-02 LAB — MICROALBUMIN / CREATININE URINE RATIO
CREATININE, U: 97.7 mg/dL
Microalb Creat Ratio: 0.7 mg/g (ref 0.0–30.0)

## 2016-08-02 LAB — HEMOGLOBIN A1C: HEMOGLOBIN A1C: 6.3 % (ref 4.6–6.5)

## 2016-08-02 MED ORDER — HYDROXYZINE HCL 10 MG PO TABS: 10.0000 mg | ORAL_TABLET | Freq: Three times a day (TID) | ORAL | 0 refills | Status: DC | PRN

## 2016-08-02 NOTE — Telephone Encounter (Signed)
[facility] called critical results, WBC - 21.0. Results given to Dr Damita Dunnings.

## 2016-08-02 NOTE — Patient Instructions (Signed)
We'll contact you with your lab report. Try hydroxyzine in the meantime if needed.  Update me in a few days.  Take care.  Glad to see you.

## 2016-08-02 NOTE — Progress Notes (Signed)
Intentional weight loss.  Job change noted.    His mother died 07-10-15, then 5 months later his grandfather died.   In the 6-12 months after that, the pain started.  Can have episodic discomfort in the chest, L and/or R arm.  Brief.  Not severe.  Sx usually happen at variable times of the day, most days.  No exertional.  Sx can happen at rest.  Sx are gradually happening more often.  He thought it was anxiety related, or at least he gets anxious about it.  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  Occ fluttering in the chest noted by patient.  CP free now.   H/o OSA on CPAP.  Unclear if he needs setting changes given the weight loss, d/w pt.  Still sleeping well.  Not snoring, compliant with CPAP.    H/o DM2 by last A1c.  Due for f/u labs.   He has pain with ROM of L shoulder at baseline, but that is a separate issue from the above.    Meds, vitals, and allergies reviewed.   ROS: Per HPI unless specifically indicated in ROS section   GEN: nad, alert and oriented HEENT: mucous membranes moist NECK: supple w/o LA CV: rrr.  PULM: ctab, no inc wob ABD: soft, +bs EXT: no edema SKIN: no acute rash  Outside EKG reviewed

## 2016-08-02 NOTE — Telephone Encounter (Signed)
This is potentially chronic and I'll await his other labs.  See notes when all resulted.

## 2016-08-03 NOTE — Assessment & Plan Note (Signed)
See notes on labs. 

## 2016-08-03 NOTE — Assessment & Plan Note (Signed)
I think at least some, if not all, of his chest sx are related to panic/anxiety.  Okay to try hydroxyzine prn in the meantime and update me in a few days . He has has sx for months episodically, not exertional.  Recheck routine labs today.  If not getting better, then would be reasonable to see cards for consideration of stress testing. At this point, still okay for outpatient f/u.  ddx and options d/w pt.  He agrees with plan.  >25 minutes spent in face to face time with patient, >50% spent in counselling or coordination of care.  See notes on labs.

## 2016-08-03 NOTE — Assessment & Plan Note (Signed)
See above

## 2016-08-03 NOTE — Assessment & Plan Note (Signed)
He is working to lose weight.  I sent a note to pulmonary asking about possible CPAP changes.

## 2016-08-06 ENCOUNTER — Telehealth: Payer: Self-pay | Admitting: Family Medicine

## 2016-08-06 NOTE — Telephone Encounter (Signed)
-----   Message from Laverle Hobby, MD sent at 08/03/2016  2:12 PM EDT ----- Patient may contact us. However he has a fairly wide range on his auto pressure of 14-22, which should provide plenty of leeway should he lose or gain weight. If he were to lose something like 20% of his body weight he might require a further downward adjustment on his pressure.  ----- Message ----- From: Tonia Ghent, MD Sent: 08/02/2016   2:24 PM To: Laverle Hobby, MD  Who should patient contact about weight changes and possible need for CPAP pressure adjustment?

## 2016-08-06 NOTE — Telephone Encounter (Signed)
Please see notes below. Please notify patient. Thanks.

## 2016-08-07 NOTE — Telephone Encounter (Signed)
Patient advised.

## 2016-08-09 ENCOUNTER — Other Ambulatory Visit: Payer: Self-pay | Admitting: Family Medicine

## 2016-08-09 ENCOUNTER — Other Ambulatory Visit (INDEPENDENT_AMBULATORY_CARE_PROVIDER_SITE_OTHER): Payer: BLUE CROSS/BLUE SHIELD

## 2016-08-09 DIAGNOSIS — D72829 Elevated white blood cell count, unspecified: Secondary | ICD-10-CM | POA: Diagnosis not present

## 2016-08-10 LAB — PATHOLOGIST SMEAR REVIEW

## 2016-08-10 LAB — CBC WITH DIFFERENTIAL/PLATELET
BASOS ABS: 154 {cells}/uL (ref 0–200)
Basophils Relative: 1 %
EOS PCT: 1 %
Eosinophils Absolute: 154 cells/uL (ref 15–500)
HCT: 42.8 % (ref 38.5–50.0)
HEMOGLOBIN: 14.4 g/dL (ref 13.2–17.1)
LYMPHS ABS: 3234 {cells}/uL (ref 850–3900)
LYMPHS PCT: 21 %
MCH: 28.3 pg (ref 27.0–33.0)
MCHC: 33.6 g/dL (ref 32.0–36.0)
MCV: 84.1 fL (ref 80.0–100.0)
MPV: 11.2 fL (ref 7.5–12.5)
Monocytes Absolute: 1386 cells/uL — ABNORMAL HIGH (ref 200–950)
Monocytes Relative: 9 %
NEUTROS PCT: 68 %
Neutro Abs: 10472 cells/uL — ABNORMAL HIGH (ref 1500–7800)
Platelets: 658 10*3/uL — ABNORMAL HIGH (ref 140–400)
RBC: 5.09 MIL/uL (ref 4.20–5.80)
RDW: 13.7 % (ref 11.0–15.0)
WBC: 15.4 10*3/uL — AB (ref 3.8–10.8)

## 2016-08-10 NOTE — Addendum Note (Signed)
Addended by: Marchia Bond on: 08/10/2016 01:24 PM   Modules accepted: Orders

## 2016-08-11 ENCOUNTER — Other Ambulatory Visit: Payer: Self-pay | Admitting: *Deleted

## 2016-08-11 MED ORDER — HYDROXYZINE HCL 10 MG PO TABS: 10.0000 mg | ORAL_TABLET | Freq: Three times a day (TID) | ORAL | 2 refills | Status: DC | PRN

## 2016-08-11 NOTE — Telephone Encounter (Signed)
Patient says this med seems to be helping but he only has 5 or 6 more.  Some days he only takes 2 but some days he has had to take more.  Please advise.

## 2016-08-11 NOTE — Telephone Encounter (Signed)
Sent. Thanks.   

## 2016-10-20 ENCOUNTER — Ambulatory Visit (INDEPENDENT_AMBULATORY_CARE_PROVIDER_SITE_OTHER): Payer: 59 | Admitting: Family Medicine

## 2016-10-20 ENCOUNTER — Encounter: Payer: Self-pay | Admitting: Family Medicine

## 2016-10-20 DIAGNOSIS — F419 Anxiety disorder, unspecified: Secondary | ICD-10-CM | POA: Diagnosis not present

## 2016-10-20 MED ORDER — HYDROXYZINE HCL 10 MG PO TABS: 10.0000 mg | ORAL_TABLET | Freq: Three times a day (TID) | ORAL | 2 refills | Status: DC | PRN

## 2016-10-20 MED ORDER — ESCITALOPRAM OXALATE 10 MG PO TABS: 10.0000 mg | ORAL_TABLET | Freq: Every day | ORAL | 3 refills | Status: DC

## 2016-10-20 NOTE — Progress Notes (Signed)
He is considering quitting dip tobacco. D/w pt.  Encouraged him.    Anxiety.  Recently started on lexapro in the last 3 days.  We had talked about anxiety prev.  He had been using hydroxyzine prev with some relief but the effect wore off.  The first day he took lexapro he noted his mood was lower but since since he has been able to tolerate.  He thinks he can tolerate the med at this point.  Yesterday was a better day, compared to the first day.    Work is going okay, variably stressful.  Family is doing well.  Wife is supportive.  He thought the anxiety started when his mother passed and "I don't know if I've ever grieved it out."  No SI/HI.  He has hope.   He is trying prayer, breathing, meditation.   Sleeping "pretty good."  CPAP is helping, he is compliant.    Meds, vitals, and allergies reviewed.   ROS: Per HPI unless specifically indicated in ROS section   GEN: nad, alert and oriented HEENT: mucous membranes moist NECK: supple w/o LA CV: rrr.  PULM: ctab, no inc wob ABD: soft, +bs Speech and judgment are normal. Affect appears normal.

## 2016-10-20 NOTE — Patient Instructions (Signed)
Take lexapro daily and use the hydroxyzine if needed.  Thanks for your effort.  Take care.  Glad to see you.

## 2016-10-22 NOTE — Assessment & Plan Note (Signed)
Discussed with patient. No suicidal or homicidal intent. Okay for outpatient follow-up. Reasonable to use Lexapro as a baseline daily medication. Routine indications and side effects discussed with patient. He can still use hydroxyzine as needed in the meantime. Update me as needed. He agrees.

## 2017-03-26 ENCOUNTER — Encounter: Payer: Self-pay | Admitting: Internal Medicine

## 2017-03-26 NOTE — Progress Notes (Signed)
[facility] Consultation      Assessment and Plan:  Obstructive sleep apnea. --Review of download data shows 95th percentile pressure of 19, the floor appears to be about 15. -Continue AutoPap pressures to a range of 14-20. --Will prescribe new supplies and add chin strap due to mask leak and dry mouth.   Obesity --BMI 51.  --Weight loss would be beneficial for his health.  --Discussed weight loss, he has started going to the gym again and has started losing weight again.   Date: 03/26/2017  MRN# [id number] [name] January 31, 1976    [name] is a 16 y.o. old male seen in consultation for chief complaint of:    Chief Complaint  Patient presents with  . Sleep Apnea    HPI:   The patient is a 42 yo male with obstructive sleep apnea.  Last visit his AutoPap pressure range was changed to 14-22.  Was also noted that he was not getting enough sleep at night, it was recommended he try to increase his current nightly sleep time of 5.5 hours.  Weight loss was also recommended given BMI of 51. He notes that he is going to bed late because he stays up too late watching television, goes to bed from 11 to 2.  He has been doing well, though the notes he has been having dry mouth in the morning.   Reviewed download data, 30 days as of 03/26/17, 30/30 days.  Average usage is 7 hours 11 minutes.  Minimum pressure is 14, maximum pressure is 20.  Median pressure is 15, 95th percentile 18, maximum 19.6.  Visual AHI is 2.5.   Medication:    Reviewed   Allergies:  Aspirin  Review of Systems: Gen:  Denies  fever, sweats, chills HEENT: Denies blurred vision, double vision. Cvc:  No dizziness, chest pain. Resp:   Denies cough or sputum production, shortness of breath Gi: Denies swallowing difficulty, stomach pain. Gu:  Denies bladder incontinence, burning urine Ext:   No Joint pain, stiffness. Skin: No skin rash,  hives  Endoc:  No polyuria, polydipsia. Psych: No  depression, insomnia. Other:  All other systems were reviewed with the patient and were negative other that what is mentioned in the HPI.   Physical Examination:   VS: BP 124/70 (BP Location: Left Arm, Cuff Size: Large)   Pulse 83   Resp 16   Ht 6' (1.829 m)   Wt (!) 357 lb (161.9 kg)   SpO2 93%   BMI 48.42 kg/m   General Appearance: No distress  Neuro:without focal findings,  speech normal,  HEENT: PERRLA, EOM intact.  [place] 3.  Pulmonary: normal breath sounds, No wheezing.  CardiovascularNormal S1,S2.  No m/r/g.   Abdomen: Benign, Soft, non-tender. Renal:  No costovertebral tenderness  GU:  No performed at this time. Endoc: No evident thyromegaly, no signs of acromegaly. Skin:   warm, no rashes, no ecchymosis  Extremities: normal, no cyanosis, clubbing.  Other findings:    LABORATORY PANEL:   CBC No results for input(s): WBC, HGB, HCT, PLT in the last 168 hours. ------------------------------------------------------------------------------------------------------------------  Chemistries  No results for input(s): NA, K, CL, CO2, GLUCOSE, BUN, CREATININE, CALCIUM, MG, AST, ALT, ALKPHOS, BILITOT in the last 168 hours.  Invalid input(s): GFRCGP ------------------------------------------------------------------------------------------------------------------  Cardiac Enzymes No results for input(s): TROPONINI in the last 168 hours. ------------------------------------------------------------  RADIOLOGY:  No results found.     Thank  you for the consultation and for allowing [facility] to assist in the care of your patient. Our recommendations are noted above.  Please contact us if we can be of further service.   Marda Stalker, MD.  Board Certified in Internal Medicine, Pulmonary Medicine, [place], and Sleep Medicine.  [facility] Office Number: [phone]  Patricia Pesa, M.D.  Merton Border, M.D  03/26/2017

## 2017-03-27 ENCOUNTER — Ambulatory Visit (INDEPENDENT_AMBULATORY_CARE_PROVIDER_SITE_OTHER): Payer: 59 | Admitting: Internal Medicine

## 2017-03-27 ENCOUNTER — Encounter: Payer: Self-pay | Admitting: Internal Medicine

## 2017-03-27 VITALS — BP 124/70 | HR 83 | Resp 16 | Ht 72.0 in | Wt 357.0 lb

## 2017-03-27 DIAGNOSIS — G4733 Obstructive sleep apnea (adult) (pediatric): Secondary | ICD-10-CM | POA: Diagnosis not present

## 2017-03-27 NOTE — Patient Instructions (Addendum)
Doing great with your CPAP, continue using it every night.   --Will need chin strap and new supplies.

## 2017-03-27 NOTE — Addendum Note (Signed)
Addended by: Stephanie Coup on: 03/27/2017 09:51 AM   Modules accepted: Orders

## 2017-04-18 ENCOUNTER — Encounter: Payer: Self-pay | Admitting: Family Medicine

## 2017-04-18 ENCOUNTER — Ambulatory Visit: Payer: 59 | Admitting: Family Medicine

## 2017-04-18 VITALS — BP 114/76 | HR 81 | Temp 98.0°F | Wt 328.0 lb

## 2017-04-18 DIAGNOSIS — J3089 Other allergic rhinitis: Secondary | ICD-10-CM

## 2017-04-18 DIAGNOSIS — D72829 Elevated white blood cell count, unspecified: Secondary | ICD-10-CM

## 2017-04-18 DIAGNOSIS — Z8639 Personal history of other endocrine, nutritional and metabolic disease: Secondary | ICD-10-CM

## 2017-04-18 LAB — CBC WITH DIFFERENTIAL/PLATELET
BASOS PCT: 1 % (ref 0.0–3.0)
Basophils Absolute: 0.2 10*3/uL — ABNORMAL HIGH (ref 0.0–0.1)
EOS PCT: 3.7 % (ref 0.0–5.0)
Eosinophils Absolute: 0.7 10*3/uL (ref 0.0–0.7)
HEMATOCRIT: 42.2 % (ref 39.0–52.0)
HEMOGLOBIN: 14.9 g/dL (ref 13.0–17.0)
LYMPHS PCT: 25 % (ref 12.0–46.0)
Lymphs Abs: 4.5 10*3/uL — ABNORMAL HIGH (ref 0.7–4.0)
MCHC: 35.5 g/dL (ref 30.0–36.0)
MCV: 83.1 fl (ref 78.0–100.0)
MONOS PCT: 14.5 % — AB (ref 3.0–12.0)
Monocytes Absolute: 2.6 10*3/uL — ABNORMAL HIGH (ref 0.1–1.0)
Neutro Abs: 10 10*3/uL — ABNORMAL HIGH (ref 1.4–7.7)
Neutrophils Relative %: 55.8 % (ref 43.0–77.0)
Platelets: 507 10*3/uL — ABNORMAL HIGH (ref 150.0–400.0)
RBC: 5.08 Mil/uL (ref 4.22–5.81)
RDW: 13.6 % (ref 11.5–15.5)
WBC: 17.9 10*3/uL — ABNORMAL HIGH (ref 4.0–10.5)

## 2017-04-18 LAB — HEMOGLOBIN A1C: Hgb A1c MFr Bld: 6 % (ref 4.6–6.5)

## 2017-04-18 MED ORDER — FLUTICASONE PROPIONATE 50 MCG/ACT NA SUSP: 2.0000 | Freq: Every day | NASAL | 6 refills | Status: DC

## 2017-04-18 NOTE — Patient Instructions (Signed)
For symptoms- take cetitizine (generic Zyrtec) and can also use over the counter antihistamine eye drops   Allergic Rhinitis, Adult Allergic rhinitis is an allergic reaction that affects the mucous membrane inside the nose. It causes sneezing, a runny or stuffy nose, and the feeling of mucus going down the back of the throat (postnasal drip). Allergic rhinitis can be mild to severe. There are two types of allergic rhinitis:  Seasonal. This type is also called hay fever. It happens only during certain seasons.  Perennial. This type can happen at any time of the year.  What are the causes? This condition happens when the body's defense system (immune system) responds to certain harmless substances called allergens as though they were germs.  Seasonal allergic rhinitis is triggered by pollen, which can come from grasses, trees, and weeds. Perennial allergic rhinitis may be caused by:  House dust mites.  Pet dander.  Mold spores.  What are the signs or symptoms? Symptoms of this condition include:  Sneezing.  Runny or stuffy nose (nasal congestion).  Postnasal drip.  Itchy nose.  Tearing of the eyes.  Trouble sleeping.  Daytime sleepiness.  How is this diagnosed? This condition may be diagnosed based on:  Your medical history.  A physical exam.  Tests to check for related conditions, such as: ? Asthma. ? Pink eye. ? Ear infection. ? Upper respiratory infection.  Tests to find out which allergens trigger your symptoms. These may include skin or blood tests.  How is this treated? There is no cure for this condition, but treatment can help control symptoms. Treatment may include:  Taking medicines that block allergy symptoms, such as antihistamines. Medicine may be given as a shot, nasal spray, or pill.  Avoiding the allergen.  Desensitization. This treatment involves getting ongoing shots until your body becomes less sensitive to the allergen. This treatment may  be done if other treatments do not help.  If taking medicine and avoiding the allergen does not work, new, stronger medicines may be prescribed.  Follow these instructions at home:  Find out what you are allergic to. Common allergens include smoke, dust, and pollen.  Avoid the things you are allergic to. These are some things you can do to help avoid allergens: ? Replace carpet with wood, tile, or vinyl flooring. Carpet can trap dander and dust. ? Do not smoke. Do not allow smoking in your home. ? Change your heating and air conditioning filter at least once a month. ? During allergy season:  Keep windows closed as much as possible.  Plan outdoor activities when pollen counts are lowest. This is usually during the evening hours.  When coming indoors, change clothing and shower before sitting on furniture or bedding.  Take over-the-counter and prescription medicines only as told by your health care provider.  Keep all follow-up visits as told by your health care provider. This is important. Contact a health care provider if:  You have a fever.  You develop a persistent cough.  You make whistling sounds when you breathe (you wheeze).  Your symptoms interfere with your normal daily activities. Get help right away if:  You have shortness of breath. Summary  This condition can be managed by taking medicines as directed and avoiding allergens.  Contact your health care provider if you develop a persistent cough or fever.  During allergy season, keep windows closed as much as possible. This information is not intended to replace advice given to you by your health care provider. Make  sure you discuss any questions you have with your health care provider. Document Released: 11/29/2000 Document Revised: 04/13/2016 Document Reviewed: 04/13/2016 Elsevier Interactive Patient Education  [employer].

## 2017-04-18 NOTE — Progress Notes (Signed)
Subjective:    Patient ID: [name], male    DOB: February 12, 1976, 42 y.o.   MRN: [id number]  HPI This is a 42 yo male who presents today with nasal drainage, cough, congestion x 10 days.  Currently eyes are watery, clear nasal drainage, no cough, no fever. Last week had some ear pressure, better now. Felt better after taking 45 days of amoxil. Sneezing frequently. No meds for symptoms currently.  Typically has allergy symptoms in fall/winter.   Working on weight loss. Has history of DM type 2, no recent labs.   Had elevated WBC, did not have follow up labs, will check today.   Past Medical History:  Diagnosis Date  . Allergy   . Anemia    hereditary spherocytosis, resolved after splenectomy in childhood per patient   Past Surgical History:  Procedure Laterality Date  . CARPAL TUNNEL RELEASE     bilateral  . PLANTAR FASCIA SURGERY Left   . SPLENECTOMY, TOTAL     Family History  Problem Relation Age of Onset  . Arthritis Mother   . Diabetes Mother   . Hypertension Mother   . Hyperlipidemia Mother   . Heart disease Mother   . Arthritis Father   . Diabetes Father   . Anemia Maternal Grandmother    Social History   Tobacco Use  . Smoking status: Former Smoker    Packs/day: 1.00    Years: 17.00    Pack years: 17.00    Types: Cigarettes  . Smokeless tobacco: Current User    Types: Snuff  . Tobacco comment: since 02/2013  Substance Use Topics  . Alcohol use: No    Alcohol/week: 0.0 oz  . Drug use: No      Review of Systems Per HPI    Objective:   Physical Exam  Constitutional: He is oriented to person, place, and time. He appears well-developed and well-nourished. No distress.  HENT:  Head: Normocephalic and atraumatic.  Right Ear: Tympanic membrane, external ear and ear canal normal.  Left Ear: Tympanic membrane, external ear and ear canal normal.  Nose: Mucosal edema and rhinorrhea present. Right sinus exhibits no maxillary sinus tenderness and no  frontal sinus tenderness. Left sinus exhibits no maxillary sinus tenderness and no frontal sinus tenderness.  Mouth/Throat: Uvula is midline. Posterior oropharyngeal erythema (post nasal drainage. ) present. No oropharyngeal exudate, posterior oropharyngeal edema or tonsillar abscesses.  Eyes: Right conjunctiva is injected. Left conjunctiva is injected.  Watery eyes.   Neck: Normal range of motion. Neck supple.  Cardiovascular: Normal rate, regular rhythm and normal heart sounds.  Pulmonary/Chest: Effort normal and breath sounds normal.  Lymphadenopathy:    He has no cervical adenopathy.  Neurological: He is alert and oriented to person, place, and time.  Skin: Skin is warm and dry. He is not diaphoretic.  Psychiatric: He has a normal mood and affect. His behavior is normal. Judgment and thought content normal.  Vitals reviewed.        BP 114/76 (BP Location: Left Arm, Patient Position: Sitting, Cuff Size: Large)   Pulse 81   Temp 98 F (36.7 C) (Oral)   Wt (!) 328 lb (148.8 kg)   SpO2 95%   BMI 44.48 kg/m  Wt Readings from Last 3 Encounters:  04/18/17 (!) 328 lb (148.8 kg)  03/27/17 (!) 357 lb (161.9 kg)  10/20/16 (!) 343 lb (155.6 kg)    Assessment & Plan:  1. Seasonal allergic rhinitis due to other allergic trigger -  Provided written and verbal information regarding diagnosis and treatment. - suggested he use otc cetrizine, antihistamine eye drops prn - fluticasone (FLONASE) 50 MCG/ACT nasal spray; Place 2 sprays into both nostrils daily.  Dispense: 16 g; Refill: 6  2. History of diabetes mellitus - overdue for labs, will check today - Hemoglobin A1c  3. Leukocytosis, unspecified type - did not have follow up labs, will check today - CBC with Differential   Clarene Reamer, FNP-BC  [facility], [place]  04/18/2017 2:32 PM

## 2017-05-01 ENCOUNTER — Other Ambulatory Visit: Payer: Self-pay | Admitting: *Deleted

## 2017-05-01 MED ORDER — ESCITALOPRAM OXALATE 10 MG PO TABS: 10.0000 mg | ORAL_TABLET | Freq: Every day | ORAL | 1 refills | Status: DC

## 2017-06-19 IMAGING — CR DG FOOT COMPLETE 3+V*R*
3 series · 3 of 3 positions shown · non-contrast
Comparison: 11/10/2013

CLINICAL DATA: Right dorsal foot pain for 1 year

EXAM:
RIGHT FOOT COMPLETE - 3+ VIEW

[view not recorded (1 of 3)]
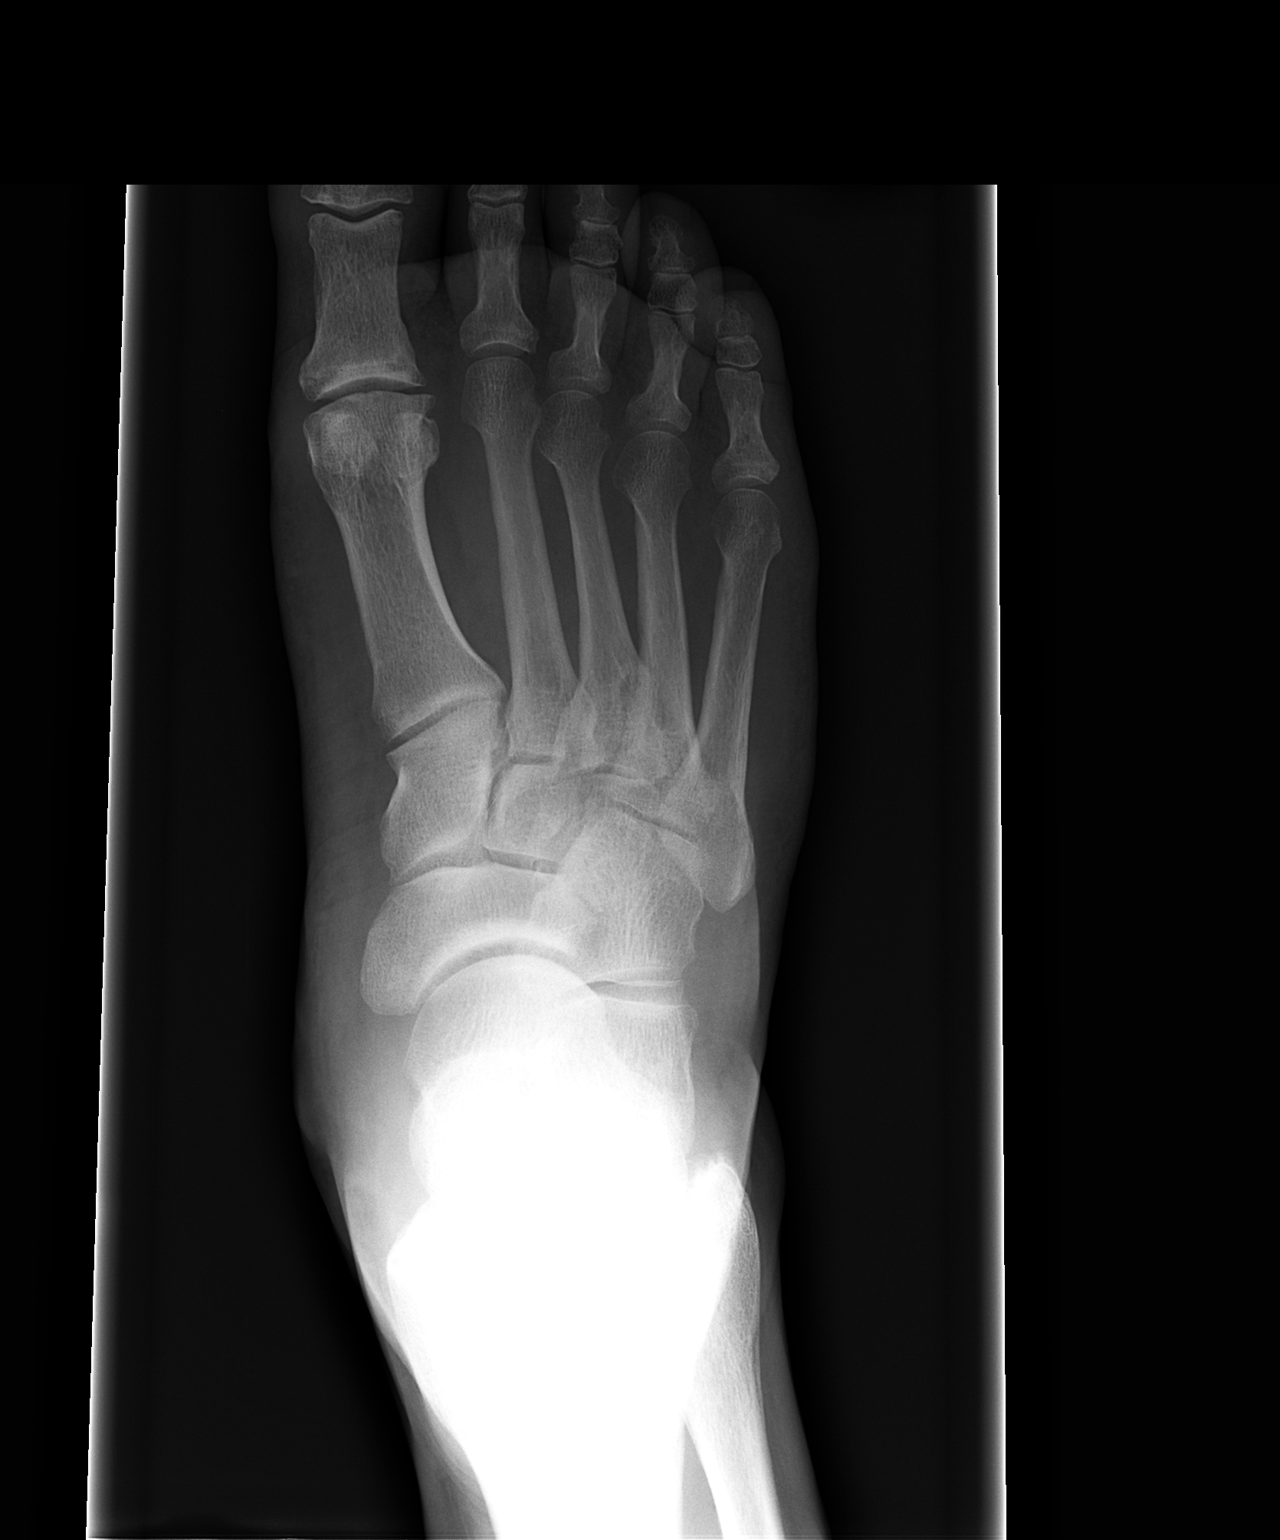

[view not recorded (2 of 3)]
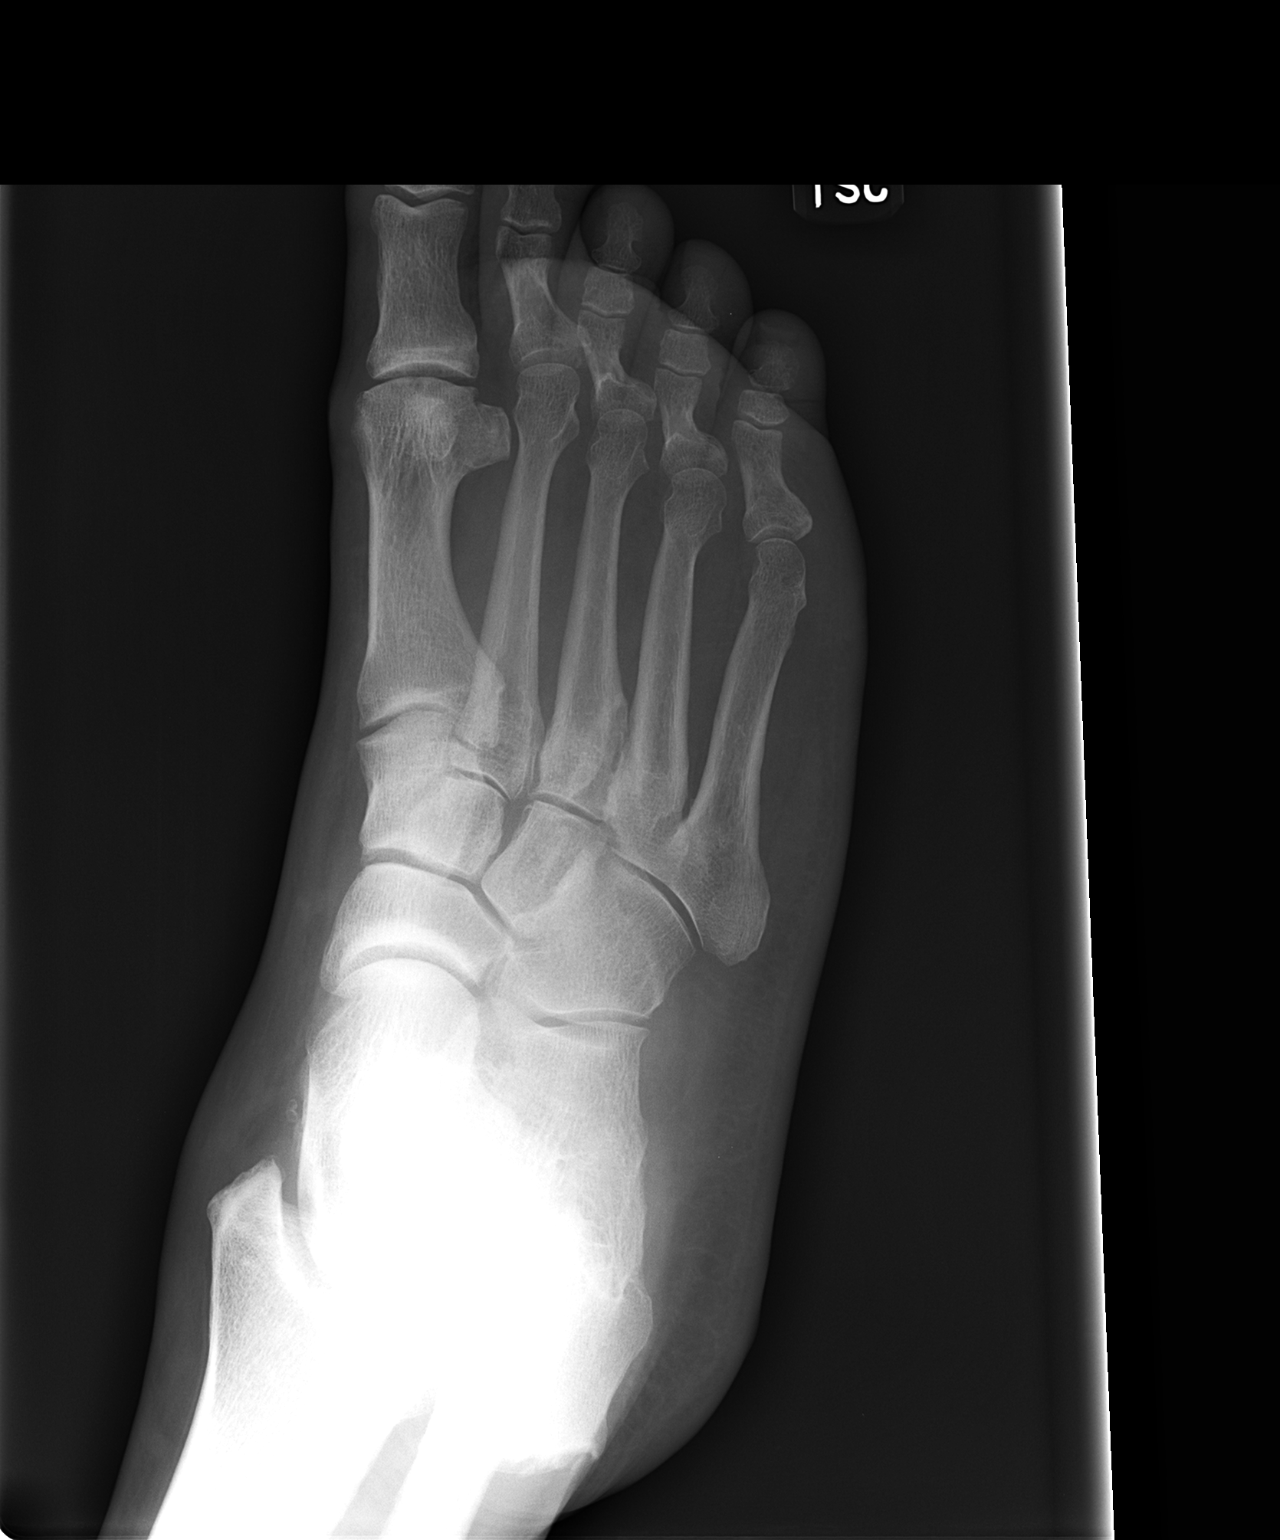

[view not recorded (3 of 3)]
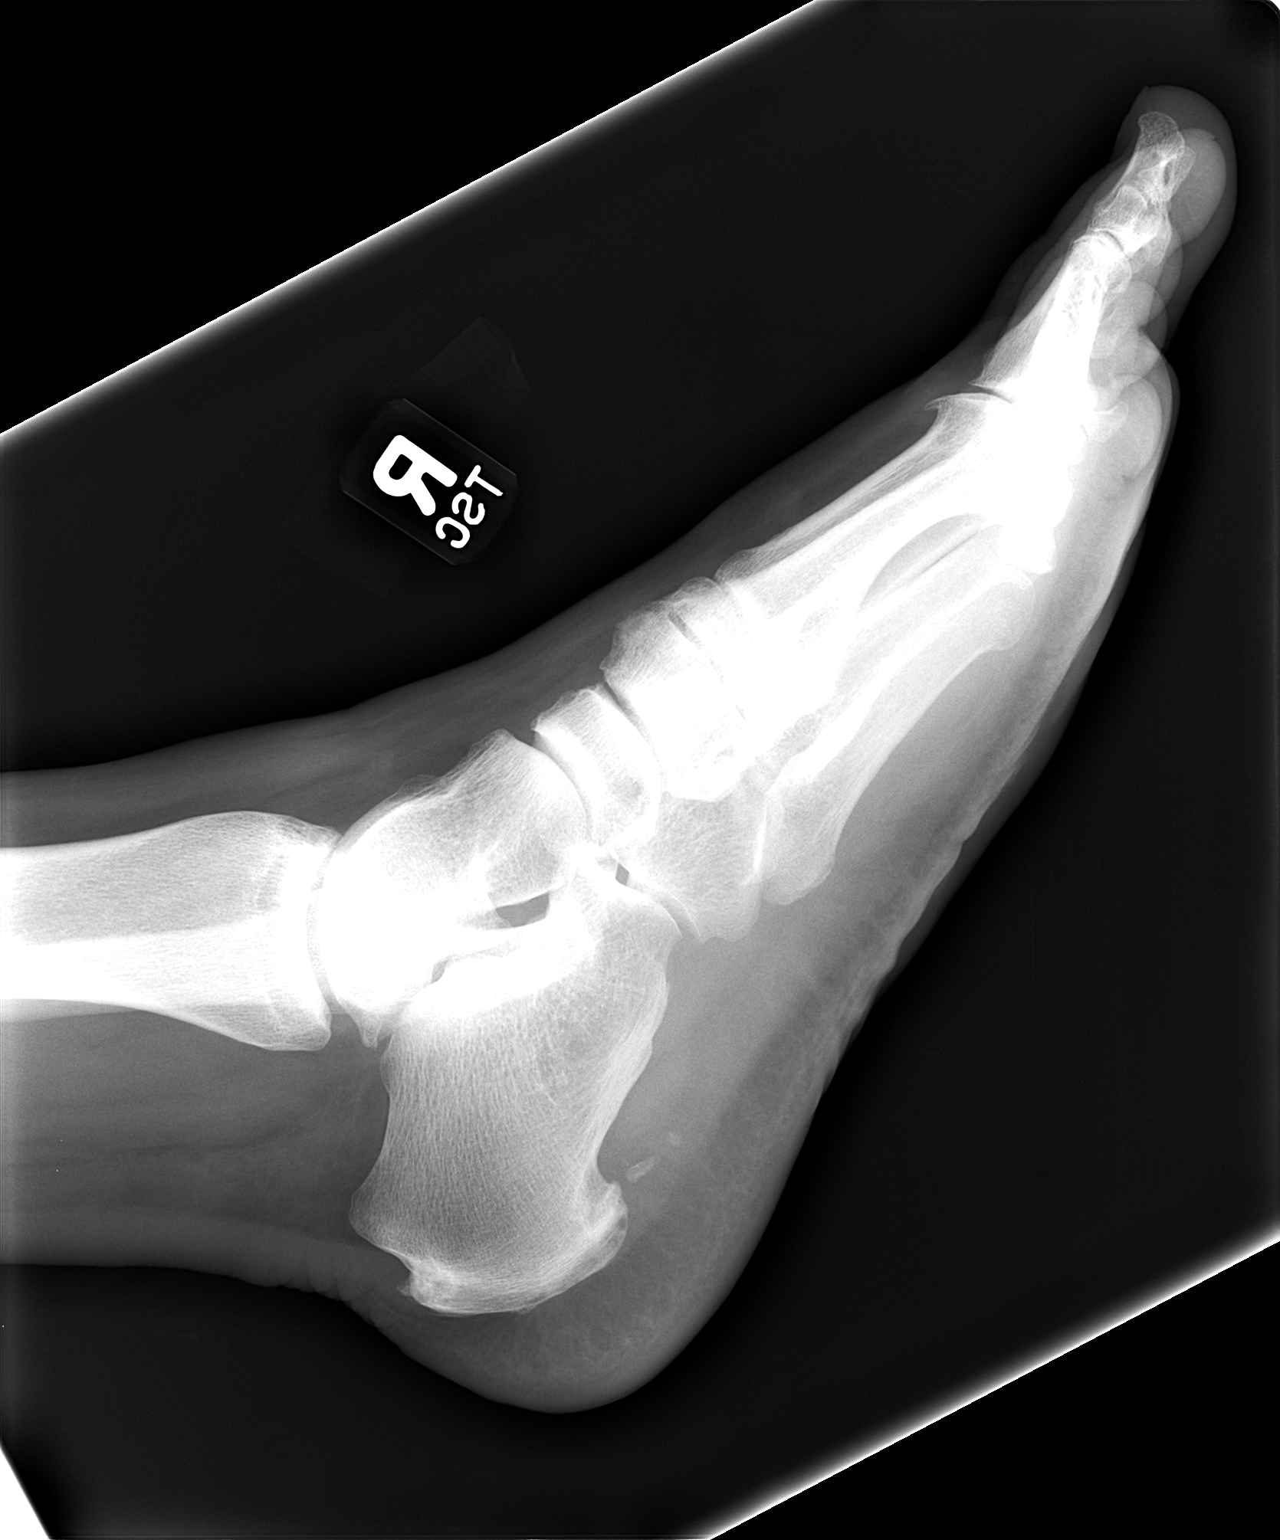

[3 of 3 positions shown; findings below may reference images not displayed]

FINDINGS: Three views of the right foot submitted. No acute fracture or
subluxation. There is small plantar and posterior spur of calcaneus.
Mild degenerative changes first metatarsal phalangeal joint. Mild
dorsal spurring tarsal metatarsal joints.
IMPRESSION: No acute fracture or subluxation. Mild degenerative changes as
described above.

## 2017-08-22 DIAGNOSIS — M9903 Segmental and somatic dysfunction of lumbar region: Secondary | ICD-10-CM | POA: Diagnosis not present

## 2017-08-22 DIAGNOSIS — M9905 Segmental and somatic dysfunction of pelvic region: Secondary | ICD-10-CM | POA: Diagnosis not present

## 2017-08-22 DIAGNOSIS — M5416 Radiculopathy, lumbar region: Secondary | ICD-10-CM | POA: Diagnosis not present

## 2017-08-23 DIAGNOSIS — M9905 Segmental and somatic dysfunction of pelvic region: Secondary | ICD-10-CM | POA: Diagnosis not present

## 2017-08-23 DIAGNOSIS — M9903 Segmental and somatic dysfunction of lumbar region: Secondary | ICD-10-CM | POA: Diagnosis not present

## 2017-08-23 DIAGNOSIS — M5416 Radiculopathy, lumbar region: Secondary | ICD-10-CM | POA: Diagnosis not present

## 2017-08-24 DIAGNOSIS — G4733 Obstructive sleep apnea (adult) (pediatric): Secondary | ICD-10-CM | POA: Diagnosis not present

## 2017-08-27 DIAGNOSIS — M5416 Radiculopathy, lumbar region: Secondary | ICD-10-CM | POA: Diagnosis not present

## 2017-08-27 DIAGNOSIS — M9903 Segmental and somatic dysfunction of lumbar region: Secondary | ICD-10-CM | POA: Diagnosis not present

## 2017-08-27 DIAGNOSIS — M9905 Segmental and somatic dysfunction of pelvic region: Secondary | ICD-10-CM | POA: Diagnosis not present

## 2017-08-29 DIAGNOSIS — M5416 Radiculopathy, lumbar region: Secondary | ICD-10-CM | POA: Diagnosis not present

## 2017-08-29 DIAGNOSIS — M9905 Segmental and somatic dysfunction of pelvic region: Secondary | ICD-10-CM | POA: Diagnosis not present

## 2017-08-29 DIAGNOSIS — M9903 Segmental and somatic dysfunction of lumbar region: Secondary | ICD-10-CM | POA: Diagnosis not present

## 2017-08-31 DIAGNOSIS — M9905 Segmental and somatic dysfunction of pelvic region: Secondary | ICD-10-CM | POA: Diagnosis not present

## 2017-08-31 DIAGNOSIS — M9903 Segmental and somatic dysfunction of lumbar region: Secondary | ICD-10-CM | POA: Diagnosis not present

## 2017-08-31 DIAGNOSIS — M5416 Radiculopathy, lumbar region: Secondary | ICD-10-CM | POA: Diagnosis not present

## 2017-09-03 DIAGNOSIS — M9905 Segmental and somatic dysfunction of pelvic region: Secondary | ICD-10-CM | POA: Diagnosis not present

## 2017-09-03 DIAGNOSIS — M5416 Radiculopathy, lumbar region: Secondary | ICD-10-CM | POA: Diagnosis not present

## 2017-09-03 DIAGNOSIS — M9903 Segmental and somatic dysfunction of lumbar region: Secondary | ICD-10-CM | POA: Diagnosis not present

## 2017-09-07 DIAGNOSIS — M9905 Segmental and somatic dysfunction of pelvic region: Secondary | ICD-10-CM | POA: Diagnosis not present

## 2017-09-07 DIAGNOSIS — M9903 Segmental and somatic dysfunction of lumbar region: Secondary | ICD-10-CM | POA: Diagnosis not present

## 2017-09-07 DIAGNOSIS — M5416 Radiculopathy, lumbar region: Secondary | ICD-10-CM | POA: Diagnosis not present

## 2017-09-21 ENCOUNTER — Emergency Department
Admission: EM | Admit: 2017-09-21 | Discharge: 2017-09-21 | Disposition: A | Discharge status: Home / Self Care | Payer: 59 | Attending: Emergency Medicine | Admitting: Emergency Medicine

## 2017-09-21 ENCOUNTER — Encounter: Payer: Self-pay | Admitting: Emergency Medicine

## 2017-09-21 DIAGNOSIS — Z5321 Procedure and treatment not carried out due to patient leaving prior to being seen by health care provider: Secondary | ICD-10-CM | POA: Diagnosis not present

## 2017-09-21 DIAGNOSIS — M545 Low back pain: Secondary | ICD-10-CM | POA: Insufficient documentation

## 2017-09-21 LAB — URINALYSIS, COMPLETE (UACMP) WITH MICROSCOPIC
Bacteria, UA: NONE SEEN
Bilirubin Urine: NEGATIVE
GLUCOSE, UA: NEGATIVE mg/dL
Hgb urine dipstick: NEGATIVE
Ketones, ur: NEGATIVE mg/dL
Leukocytes, UA: NEGATIVE
Nitrite: NEGATIVE
PH: 7 (ref 5.0–8.0)
PROTEIN: NEGATIVE mg/dL
SPECIFIC GRAVITY, URINE: 1.015 (ref 1.005–1.030)

## 2017-09-21 NOTE — ED Notes (Signed)
Pt called for room without answer.

## 2017-09-21 NOTE — ED Notes (Signed)
Pt called to room without answer. Looked outside as well.

## 2017-09-21 NOTE — ED Triage Notes (Signed)
Patient reports lower back pain for over a month.  Patient states he had been drinking a lot of diet mountain dew and thought that could be causing the back pain and so for the past 4 days he has only been drinking water.  Patient states now the pain is worse.  Patient states he has had kidney stones in the past and this feels similar.  Patient states pain is across his whole lower back, not just on one side.  Patient denies nausea and vomiting.

## 2017-09-25 ENCOUNTER — Telehealth: Payer: Self-pay | Admitting: Emergency Medicine

## 2017-09-25 NOTE — Telephone Encounter (Signed)
Called patient due to lwot to inquire about condition and follow up plans.  No answer and no voicemail  

## 2017-10-18 ENCOUNTER — Encounter: Payer: Self-pay | Admitting: Family Medicine

## 2017-10-18 ENCOUNTER — Ambulatory Visit: Payer: 59 | Admitting: Family Medicine

## 2017-10-18 VITALS — BP 118/70 | HR 86 | Temp 98.3°F | Ht 72.0 in | Wt 365.5 lb

## 2017-10-18 DIAGNOSIS — M5416 Radiculopathy, lumbar region: Secondary | ICD-10-CM | POA: Diagnosis not present

## 2017-10-18 MED ORDER — PREDNISONE 20 MG PO TABS: ORAL_TABLET | ORAL | 0 refills | Status: DC

## 2017-10-18 MED ORDER — TIZANIDINE HCL 4 MG PO TABS: 4.0000 mg | ORAL_TABLET | Freq: Every evening | ORAL | 1 refills | Status: DC

## 2017-10-18 NOTE — Progress Notes (Signed)
Dr. Frederico Hamman T. Quron Ruddy, MD, [facility] [place], [address] Phone: [phone] Fax: [phone]  10/18/2017  Patient: [name], MRN: [id number], DOB: 1975/06/11, 42 y.o.  Primary Physician:  Tonia Ghent, MD   Chief Complaint  Patient presents with  . Sciatica    x 3 days   Subjective:   [name] is a 25 y.o. very pleasant male patient who presents with the following:  R sided radiculopathy in the R leg. Has had on the L side before.  R buttocks and down the leg.  He is never had right-sided radiculopathy in the past, and he has never had any prior spine surgery. Body mass index is 49.57 kg/m.  He does work as a Development worker, community.  He is down on his knees and up and down quite a bit.  He is not having any numbness or tingling in his leg, and is not having any weakness.  He is not having any saddle anesthesia or bowel or bladder incontinence.  Several weeks ago he did do some chiropractic maneuvers.   Past Medical History, Surgical History, Social History, Family History, Problem List, Medications, and Allergies have been reviewed and updated if relevant.  Patient Active Problem List   Diagnosis Date Noted  . Elevated blood pressure reading 02/07/2016  . Anxiety 02/07/2016  . OSA on CPAP 12/09/2015  . Nocturia 12/09/2015  . Pain in joint, ankle and foot 09/30/2014  . History of diabetes mellitus 01/14/2012  . AC joint pain 01/14/2012  . Obesity 02/28/2011  . Smoking 02/28/2011  . 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 Allergy   . Anemia    hereditary spherocytosis, resolved after splenectomy in childhood per patient    Past Surgical History:  Procedure Laterality Date  . CARPAL TUNNEL RELEASE     bilateral  . PLANTAR FASCIA SURGERY Left   . SPLENECTOMY, TOTAL      Social History   Socioeconomic History  . Marital status: Married    Spouse name: Not on file   . Number of children: Not on file  . Years of education: Not on file  . Highest education level: Not on file  Occupational History  . Not on file  Social Needs  . Financial resource strain: Not on file  . Food insecurity:    Worry: Not on file    Inability: Not on file  . Transportation needs:    Medical: Not on file    Non-medical: Not on file  Tobacco Use  . Smoking status: Former Smoker    Packs/day: 1.00    Years: 17.00    Pack years: 17.00    Types: Cigarettes  . Smokeless tobacco: Current User    Types: Snuff  . Tobacco comment: since 02/2013  Substance and Sexual Activity  . Alcohol use: No    Alcohol/week: 0.0 oz  . Drug use: No  . Sexual activity: Not on file  Lifestyle  . Physical activity:    Days per week: Not on file    Minutes per session: Not on file  . Stress: Not on file  Relationships  . Social connections:    Talks on phone: Not on file    Gets together: Not on file    Attends religious service: Not on file    Active member of club or organization: Not on file    Attends meetings  of clubs or organizations: Not on file    Relationship status: Not on file  . Intimate partner violence:    Fear of current or ex partner: Not on file    Emotionally abused: Not on file    Physically abused: Not on file    Forced sexual activity: Not on file  Other Topics Concern  . Not on file  Social History Narrative   Married 2009, 2 step kids   Warehouse work as of 2018    Family History  Problem Relation Age of Onset  . Arthritis Mother   . Diabetes Mother   . Hypertension Mother   . Hyperlipidemia Mother   . Heart disease Mother   . Arthritis Father   . Diabetes Father   . Anemia Maternal Grandmother     Allergies  Allergen Reactions  . Aspirin     Nosebleeds    Medication list reviewed and updated in full in [place].  GEN: no acute illness or fever CV: No chest pain or shortness of breath MSK: detailed above Neuro: neurological  signs are described above ROS O/w per HPI  Objective:   BP 118/70   Pulse 86   Temp 98.3 F (36.8 C) (Oral)   Ht 6' (1.829 m)   Wt (!) 365 lb 8 oz (165.8 kg)   BMI 49.57 kg/m    GEN: Well-developed,well-nourished,in no acute distress; alert,appropriate and cooperative throughout examination HEENT: Normocephalic and atraumatic without obvious abnormalities. Ears, externally no deformities PULM: Breathing comfortably in no respiratory distress EXT: No clubbing, cyanosis, or edema PSYCH: Normally interactive. Cooperative during the interview. Pleasant. Friendly and conversant. Not anxious or depressed appearing. Normal, full affect.  Range of motion at  the waist: Flexion, extension, lateral bending and rotation: He does have only modest limitation in all planes of movement.  No echymosis or edema Rises to examination table with mild difficulty Gait: minimally antalgic  Inspection/Deformity: N Paraspinus Tenderness: Markedly tender on the right compared to the left from L2-S1  B Ankle Dorsiflexion (L5,4): 5/5 B Great Toe Dorsiflexion (L5,4): 5/5 Heel Walk (L5): WNL Toe Walk (S1): WNL Rise/Squat (L4): WNL, mild pain  SENSORY B Medial Foot (L4): WNL B Dorsum (L5): WNL B Lateral (S1): WNL Light Touch: WNL Pinprick: WNL  REFLEXES Knee (L4): 2+ Ankle (S1): 2+  B SLR, seated: neg B SLR, supine: + B FABER: neg B Reverse FABER: neg B Greater Troch: NT B Log Roll: neg B Stork: NT B Sciatic Notch: ttp  Radiology: No results found.  Assessment and Plan:   Right lumbar radiculopathy  No weakness or numbness.  Hopefully we can get this to resolve in short order.  I gave him some prednisone as well as some Zanaflex to take at nighttime.  We also talked about some basic stretching he can do at nighttime.  Follow-up: No follow-ups on file.  Meds ordered this encounter  Medications  . predniSONE (DELTASONE) 20 MG tablet    Sig: 2 tabs po for 5 days, then 1 tab po  for 5 days    Dispense:  15 tablet    Refill:  0  . tiZANidine (ZANAFLEX) 4 MG tablet    Sig: Take 1 tablet (4 mg total) by mouth Nightly.    Dispense:  30 tablet    Refill:  1   Signed,  Khilee Hendricksen T. Onis Markoff, MD   Allergies as of 10/18/2017      Reactions   Aspirin    Nosebleeds  Medication List        Accurate as of 10/18/17 11:59 PM. Always use your most recent med list.          escitalopram 10 MG tablet Commonly known as:  LEXAPRO Take 1 tablet (10 mg total) by mouth daily.   predniSONE 20 MG tablet Commonly known as:  DELTASONE 2 tabs po for 5 days, then 1 tab po for 5 days   tiZANidine 4 MG tablet Commonly known as:  ZANAFLEX Take 1 tablet (4 mg total) by mouth Nightly.

## 2017-12-12 ENCOUNTER — Other Ambulatory Visit: Payer: Self-pay | Admitting: Family Medicine

## 2017-12-12 NOTE — Telephone Encounter (Signed)
Sent. If still needed frequently then needs recheck.   Needs f/u at some point this fall rx: lexapro.  Thanks.

## 2017-12-12 NOTE — Telephone Encounter (Signed)
Spoke with patient, patient advised as below. Patient was driving when I called so he said he would call back to [place], RMA

## 2017-12-12 NOTE — Telephone Encounter (Signed)
Please schedule appointment as instructed. 

## 2017-12-12 NOTE — Telephone Encounter (Signed)
Last office visit 10/18/2017 with Dr. Lorelei Pont. No future appointments.  Last refilled 10/18/2017 for #30 with 1 refill by Dr. Lorelei Pont.  Ok to refill?

## 2018-04-29 NOTE — Progress Notes (Deleted)
  [facility] Consultation      Assessment and Plan:  Obstructive sleep apnea. --Review of download data shows 95th percentile pressure of 19, the floor appears to be about 15. -Continue AutoPap pressures to a range of 14-20. --Will prescribe new supplies and add chin strap due to mask leak and dry mouth.   Obesity. --BMI 51.  --Weight loss would be beneficial for his health.  --Discussed weight loss, he has started going to the gym again and has started losing weight again.   Date: 04/29/2018  MRN# [id number] [name] [birth year]    [name] is a 75 y.o. old male seen in consultation for chief complaint of:    No chief complaint on file.   HPI:   The patient is a 43 yo male with obstructive sleep apnea.  Last visit his AutoPap pressure range was changed to 14-22.  Was also noted that he was not getting enough sleep at night, it was recommended he try to increase his current nightly sleep time of 5.5 hours.  Weight loss was also recommended given BMI of 51. He notes that he is going to bed late because he stays up too late watching television, goes to bed from 11 to 2.  He has been doing well, though the notes he has been having dry mouth in the morning.   Reviewed download data, 30 days as of 03/26/17, 30/30 days.  Average usage is 7 hours 11 minutes.  Minimum pressure is 14, maximum pressure is 20.  Median pressure is 15, 95th percentile 18, maximum 19.6.  Visual AHI is 2.5.   Medication:    Reviewed   Allergies:  Aspirin      LABORATORY PANEL:   CBC No results for input(s): WBC, HGB, HCT, PLT in the last 168 hours. ------------------------------------------------------------------------------------------------------------------  Chemistries  No results for input(s): NA, K, CL, CO2, GLUCOSE, BUN, CREATININE, CALCIUM, MG, AST, ALT, ALKPHOS, BILITOT in the last 168 hours.  Invalid input(s):  GFRCGP ------------------------------------------------------------------------------------------------------------------  Cardiac Enzymes No results for input(s): TROPONINI in the last 168 hours. ------------------------------------------------------------  RADIOLOGY:  No results found.     Thank  you for the consultation and for allowing [facility] to assist in the care of your patient. Our recommendations are noted above.  Please contact us if we can be of further service.  Marda Stalker, M.D., F.C.C.P.  Board Certified in Internal Medicine, Pulmonary Medicine, [place], and Sleep Medicine.  [facility] Office Number: [phone]  04/29/2018

## 2018-04-30 ENCOUNTER — Ambulatory Visit: Payer: 59 | Admitting: Internal Medicine

## 2018-05-01 ENCOUNTER — Encounter: Payer: Self-pay | Admitting: Internal Medicine

## 2018-05-15 ENCOUNTER — Encounter: Payer: Self-pay | Admitting: Internal Medicine

## 2018-05-19 ENCOUNTER — Other Ambulatory Visit: Payer: Self-pay | Admitting: Family Medicine

## 2018-05-20 ENCOUNTER — Encounter: Payer: Self-pay | Admitting: *Deleted

## 2018-05-20 ENCOUNTER — Other Ambulatory Visit: Payer: Self-pay | Admitting: Family Medicine

## 2018-05-20 NOTE — Telephone Encounter (Signed)
Electronic refill request. Tizanidine Last office visit:   10/18/17 ([place]) Last Filled:    30 tablet 1 12/12/2017  Please advise.

## 2018-05-21 NOTE — Telephone Encounter (Signed)
Sent. Thanks. Needs CPE.

## 2018-05-21 NOTE — Telephone Encounter (Signed)
Please call patient and scheduled CPE as instructed.

## 2018-05-23 NOTE — Telephone Encounter (Signed)
Tammy pts wife (DPR signed) request refill lexapro to [facility]. I advised refill should be ready for pick up and Tammy will contact pharmacy. nothing further needed.

## 2018-05-27 NOTE — Telephone Encounter (Signed)
Pt scheduled for lab on 06/12/18 @ 8am and CPE on 06/17/18 @ 9:30am

## 2018-06-11 ENCOUNTER — Other Ambulatory Visit: Payer: Self-pay | Admitting: Family Medicine

## 2018-06-11 DIAGNOSIS — Z8639 Personal history of other endocrine, nutritional and metabolic disease: Secondary | ICD-10-CM

## 2018-06-12 ENCOUNTER — Other Ambulatory Visit: Payer: Self-pay

## 2018-06-17 ENCOUNTER — Encounter: Payer: Self-pay | Admitting: Family Medicine

## 2018-08-14 ENCOUNTER — Other Ambulatory Visit: Payer: Self-pay

## 2018-08-14 ENCOUNTER — Ambulatory Visit (INDEPENDENT_AMBULATORY_CARE_PROVIDER_SITE_OTHER)
Admission: RE | Admit: 2018-08-14 | Discharge: 2018-08-14 | Disposition: A | Discharge status: Home / Self Care | Payer: BLUE CROSS/BLUE SHIELD | Source: Ambulatory Visit | Attending: Family Medicine | Admitting: Family Medicine

## 2018-08-14 ENCOUNTER — Ambulatory Visit: Payer: BLUE CROSS/BLUE SHIELD | Admitting: Family Medicine

## 2018-08-14 ENCOUNTER — Encounter: Payer: Self-pay | Admitting: Family Medicine

## 2018-08-14 VITALS — BP 102/64 | HR 74 | Temp 97.7°F | Ht 72.0 in | Wt 365.5 lb

## 2018-08-14 DIAGNOSIS — M545 Low back pain, unspecified: Secondary | ICD-10-CM

## 2018-08-14 DIAGNOSIS — G8929 Other chronic pain: Secondary | ICD-10-CM

## 2018-08-14 NOTE — Patient Instructions (Signed)
REFERRALS TO SPECIALISTS, SPECIAL TESTS (MRI, CT, ULTRASOUNDS)  MARION or  Anastasiya will help you. ASK CHECK-IN FOR HELP.  Specialist appointment times vary a great deal, based on their schedule / openings. -- Some specialists have very long wait times. (Example. Dermatology)    

## 2018-08-14 NOTE — Progress Notes (Signed)
Latham Kinzler T. Devony Mcgrady, MD Primary Care and Sports Medicine [facility] at [facility] [place], [address] Phone: [phone]  FAX: [phone]  [name] - 43 y.o. male  MRN [id number]  Date of Birth: 01-Jun-1975  Visit Date: 08/14/2018  PCP: Tonia Ghent, MD  Referred by: Tonia Ghent, MD  Chief Complaint  Patient presents with  . Back Pain   Subjective:   [name] is a 47 y.o. very pleasant male patient who presents with the following:  Seen 8/19 with R lumbar radiculopathy: Body mass index is 49.57 kg/m.   He is a gentleman who works in a [employer] who presents with an intermittent history of back pain off and on for 5 to 8 years.  Did see him once myself.  Today, he is not all that symptomatic, but he is primarily interested in long-term management recommendations.  He has tried some chiropractic manipulation, as well as NSAIDs and Tylenol for pain.  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  Intermittent R and L.  Feels a lot better today.  Works in [employer]. A lot of lifting. Water heaters and granity.  5-8 year history of back.  If on his feet, daily.  Some pain in the butt, ? R numbness.  NSAIDS and tylenol.   Weight loss and weight loss surgery? Voltaren and zanaflex PT  Past Medical History, Surgical History, Social History, Family History, Problem List, Medications, and Allergies have been reviewed and updated if relevant.  Patient Active Problem List   Diagnosis Date Noted  . Elevated blood pressure reading 02/07/2016  . Anxiety 02/07/2016  . OSA on CPAP 12/09/2015  . Nocturia 12/09/2015  . Pain in joint, ankle and foot 09/30/2014  . History of diabetes mellitus 01/14/2012  . AC joint pain 01/14/2012  . Obesity 02/28/2011  . Smoking 02/28/2011  . 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 Allergy   . Anemia    hereditary spherocytosis, resolved after splenectomy in childhood per patient    Past Surgical History:  Procedure Laterality Date  . CARPAL TUNNEL RELEASE     bilateral  . PLANTAR FASCIA SURGERY Left   . SPLENECTOMY, TOTAL      Social History   Socioeconomic History  . Marital status: Married    Spouse name: Not on file  . Number of children: Not on file  . Years of education: Not on file  . Highest education level: Not on file  Occupational History  . Not on file  Social Needs  . Financial resource strain: Not on file  . Food insecurity:    Worry: Not on file    Inability: Not on file  . Transportation needs:    Medical: Not on file    Non-medical: Not on file  Tobacco Use  . Smoking status: Former Smoker    Packs/day: 1.00    Years: 17.00    Pack years: 17.00    Types: Cigarettes  . Smokeless tobacco: Current User    Types: Snuff  . Tobacco comment: since 02/2013  Substance and Sexual Activity  . Alcohol use: No    Alcohol/week: 0.0 standard drinks  . Drug use: No  . Sexual activity: Not on file  Lifestyle  . Physical activity:    Days per week: Not on file  Minutes per session: Not on file  . Stress: Not on file  Relationships  . Social connections:    Talks on phone: Not on file    Gets together: Not on file    Attends religious service: Not on file    Active member of club or organization: Not on file    Attends meetings of clubs or organizations: Not on file    Relationship status: Not on file  . Intimate partner violence:    Fear of current or ex partner: Not on file    Emotionally abused: Not on file    Physically abused: Not on file    Forced sexual activity: Not on file  Other Topics Concern  . Not on file  Social History Narrative   Married 2009, 2 step kids   Warehouse work as of 2018    Family History  Problem Relation Age of Onset  . Arthritis Mother   . Diabetes Mother   . Hypertension Mother   .  Hyperlipidemia Mother   . Heart disease Mother   . Arthritis Father   . Diabetes Father   . Anemia Maternal Grandmother     Allergies  Allergen Reactions  . Aspirin     Nosebleeds    Medication list reviewed and updated in full in [place].  GEN: no acute illness or fever CV: No chest pain or shortness of breath MSK: detailed above Neuro: neurological signs are described above ROS O/w per HPI  Objective:   BP 102/64   Pulse 74   Temp 97.7 F (36.5 C) (Oral)   Ht 6' (1.829 m)   Wt (!) 365 lb 8 oz (165.8 kg)   BMI 49.57 kg/m    GEN: Well-developed,well-nourished,in no acute distress; alert,appropriate and cooperative throughout examination HEENT: Normocephalic and atraumatic without obvious abnormalities. Ears, externally no deformities PULM: Breathing comfortably in no respiratory distress EXT: No clubbing, cyanosis, or edema PSYCH: Normally interactive. Cooperative during the interview. Pleasant. Friendly and conversant. Not anxious or depressed appearing. Normal, full affect.  Range of motion at  the waist: Flexion: normal Extension: normal Lateral bending: normal Rotation: all normal  No echymosis or edema Rises to examination table with no difficulty Gait: non antalgic  Inspection/Deformity: N Paraspinus Tenderness: NT l1-s1  B Ankle Dorsiflexion (L5,4): 5/5 B Great Toe Dorsiflexion (L5,4): 5/5 Heel Walk (L5): WNL Toe Walk (S1): WNL Rise/Squat (L4): WNL  SENSORY B Medial Foot (L4): WNL B Dorsum (L5): WNL B Lateral (S1): WNL Light Touch: WNL Pinprick: WNL  REFLEXES Knee (L4): 2+ Ankle (S1): 2+  B SLR, seated: neg B SLR, supine: neg B FABER: neg B Reverse FABER: neg B Greater Troch: NT B Log Roll: neg B Stork: NT B Sciatic Notch: NT   Radiology: Dg Lumbar Spine Complete  Result Date: 08/14/2018 CLINICAL DATA:  Low back pain EXAM: LUMBAR SPINE - COMPLETE 4+ VIEW COMPARISON:  None. FINDINGS: Degenerative disc disease at L5-S1  with disc space narrowing and early anterior spurring. Mild degenerative facet disease throughout the lumbar spine. Normal alignment. No fracture. SI joints symmetric and unremarkable. IMPRESSION: Degenerative disc and facet disease as above. No acute bony abnormality. Electronically Signed   By: Rolm Baptise M.D.   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  Prognosis is fair to poor.  He has no emergent or acute findings, but I would anticipate that he will have intermittent back pain indefinitely.  I encouraged him to lose weight at all costs from an orthopedic and medical standpoint.  He was open to some of these conversations, and he was open to a bariatrics consultation for medical bariatrics management as well as potential surgical bariatric management.  We talked about physical therapy, gave him some spine rehabilitation paperwork as well as videos to watch, but the primary issue here is 1 of BMI 50.  I appreciate the opportunity to evaluate this very friendly patient. If you have any question regarding his care or prognosis, do not hesitate to ask.   Follow-up: prn with me.  Bariatrics.  Orders Placed This Encounter  Procedures  . DG Lumbar Spine Complete  . Amb Referral to Bariatric Surgery    Signed,  Frederico Hamman T. Darius Fillingim, MD   Outpatient Encounter Medications as of 08/14/2018  Medication Sig  . escitalopram (LEXAPRO) 10 MG tablet TAKE 1 TABLET BY MOUTH EVERY DAY  . [DISCONTINUED] predniSONE (DELTASONE) 20 MG tablet 2 tabs po for 5 days, then 1  tab po for 5 days  . [DISCONTINUED] tiZANidine (ZANAFLEX) 4 MG tablet TAKE 1 TABLET BY MOUTH EVERY DAY IN THE EVENING   No facility-administered encounter medications on file as of 08/14/2018.

## 2018-08-15 ENCOUNTER — Other Ambulatory Visit: Payer: Self-pay | Admitting: Family Medicine

## 2018-08-23 ENCOUNTER — Other Ambulatory Visit (INDEPENDENT_AMBULATORY_CARE_PROVIDER_SITE_OTHER): Payer: BLUE CROSS/BLUE SHIELD

## 2018-08-23 DIAGNOSIS — Z8639 Personal history of other endocrine, nutritional and metabolic disease: Secondary | ICD-10-CM | POA: Diagnosis not present

## 2018-08-23 LAB — COMPREHENSIVE METABOLIC PANEL
ALT: 20 U/L (ref 0–53)
AST: 13 U/L (ref 0–37)
Albumin: 4 g/dL (ref 3.5–5.2)
Alkaline Phosphatase: 91 U/L (ref 39–117)
BUN: 19 mg/dL (ref 6–23)
CO2: 29 mEq/L (ref 19–32)
Calcium: 9.4 mg/dL (ref 8.4–10.5)
Chloride: 101 mEq/L (ref 96–112)
Creatinine, Ser: 0.81 mg/dL (ref 0.40–1.50)
GFR: 103.77 mL/min (ref >60.00)
Glucose, Bld: 198 mg/dL — ABNORMAL HIGH (ref 70–99)
Potassium: 4.9 mEq/L (ref 3.5–5.1)
Sodium: 137 mEq/L (ref 135–145)
Total Bilirubin: 0.3 mg/dL (ref 0.2–1.2)
Total Protein: 6.9 g/dL (ref 6.0–8.3)

## 2018-08-23 LAB — LIPID PANEL
Cholesterol: 162 mg/dL (ref 0–200)
HDL: 21.4 mg/dL — ABNORMAL LOW (ref >39.00)
NonHDL: 140.45
Total CHOL/HDL Ratio: 8
Triglycerides: 314 mg/dL — ABNORMAL HIGH (ref 0.0–149.0)
VLDL: 62.8 mg/dL — ABNORMAL HIGH (ref 0.0–40.0)

## 2018-08-23 LAB — LDL CHOLESTEROL, DIRECT: Direct LDL: 114 mg/dL

## 2018-08-23 LAB — HEMOGLOBIN A1C: Hgb A1c MFr Bld: 6.5 % (ref 4.6–6.5)

## 2018-08-25 ENCOUNTER — Encounter: Payer: Self-pay | Admitting: Family Medicine

## 2018-08-25 DIAGNOSIS — E119 Type 2 diabetes mellitus without complications: Secondary | ICD-10-CM | POA: Insufficient documentation

## 2018-08-30 ENCOUNTER — Ambulatory Visit (INDEPENDENT_AMBULATORY_CARE_PROVIDER_SITE_OTHER): Payer: BC Managed Care – PPO | Admitting: Family Medicine

## 2018-08-30 ENCOUNTER — Encounter: Payer: Self-pay | Admitting: Family Medicine

## 2018-08-30 VITALS — Wt 351.0 lb

## 2018-08-30 DIAGNOSIS — Z9081 Acquired absence of spleen: Secondary | ICD-10-CM

## 2018-08-30 DIAGNOSIS — Z7189 Other specified counseling: Secondary | ICD-10-CM

## 2018-08-30 DIAGNOSIS — G4733 Obstructive sleep apnea (adult) (pediatric): Secondary | ICD-10-CM

## 2018-08-30 DIAGNOSIS — Z0000 Encounter for general adult medical examination without abnormal findings: Secondary | ICD-10-CM

## 2018-08-30 DIAGNOSIS — E669 Obesity, unspecified: Secondary | ICD-10-CM

## 2018-08-30 DIAGNOSIS — Z0001 Encounter for general adult medical examination with abnormal findings: Secondary | ICD-10-CM

## 2018-08-30 DIAGNOSIS — E119 Type 2 diabetes mellitus without complications: Secondary | ICD-10-CM

## 2018-08-30 DIAGNOSIS — Z9989 Dependence on other enabling machines and devices: Secondary | ICD-10-CM

## 2018-08-30 DIAGNOSIS — F419 Anxiety disorder, unspecified: Secondary | ICD-10-CM

## 2018-08-30 MED ORDER — ESCITALOPRAM OXALATE 10 MG PO TABS: 10.0000 mg | ORAL_TABLET | Freq: Every day | ORAL | 3 refills | Status: DC

## 2018-08-30 NOTE — Progress Notes (Signed)
Virtual visit completed through WebEx or similar program Patient location: home  Provider location: Financial controller at [facility], office   Pandemic considerations d/w pt.   Limitations and rationale for visit method d/w patient.  Patient agreed to proceed.   CC:  HPI:  Tetanus done more than 5 but less than 10 years ago.   Flu encouraged.   PNA d/w pt.    Shingles not due.  PSA and colon cancer screening not due.  Living will d/w pt. Wife designated if patient were incapacitated.  Diet and exercise d/w pt.   He had seen outside clinic re: HCG re: weight loss.  I asked him to get me a copy of the outside labs and reports.  I asked him to check with that clinic about monitoring, etc.  See f/u phone note re: HCG  S/p splenectomy.  He didn't recall getting meningitis shot.  See f/u phone note.    Mood d/w pt.  Anxiety clearly better.  No ADE on med.  No SI/HI.    Diabetes:  No meds.  Dx by A1c.  Hypoglycemic episodes: rare mild episodes with prolonged fasting.  Hyperglycemic episodes: no Feet problems: no Blood Sugars averaging: not checked.  eye exam within last year: d/w pt, done about 5-6 months ago.   Will set up A1c in about 3-4 months.    OSA.  Still on CPAP.  He feels better with use.  No air leaks.   PMH SH FH reviewed.   Meds and allergies reviewed.   ROS: Per HPI unless specifically indicated in ROS section   NAD Speech wnl  A/P:  Tetanus done more than 5 but less than 10 years ago.   Flu encouraged.   PNA d/w pt.    Shingles not due.  PSA and colon cancer screening not due.  Living will d/w pt. Wife designated if patient were incapacitated.  Diet and exercise d/w pt.   S/p splenectomy.  He didn't recall getting meningitis shot.  See f/u phone note.    Mood d/w pt.  Anxiety clearly better.  No ADE on med.  No SI/HI.    Diabetes:  No meds.  Dx by A1c.  Hypoglycemic episodes: rare mild episodes with prolonged fasting.  Hyperglycemic episodes: no Feet  problems: no Blood Sugars averaging: not checked.  eye exam within last year: d/w pt, done about 5-6 months ago.   Will set up A1c in about 3-4 months.    OSA.  Still on CPAP.  He feels better with use.  No air leaks.  Continue as is.  D/w pt about weight loss.     He had seen outside clinic re: HCG re: weight loss.  I asked him to get me a copy of the outside labs and reports.  I asked him to check with that clinic about monitoring, etc.  See f/u phone note re: HCG

## 2018-09-01 ENCOUNTER — Telehealth: Payer: Self-pay | Admitting: Family Medicine

## 2018-09-01 DIAGNOSIS — Z0001 Encounter for general adult medical examination with abnormal findings: Secondary | ICD-10-CM | POA: Insufficient documentation

## 2018-09-01 DIAGNOSIS — Z7189 Other specified counseling: Secondary | ICD-10-CM | POA: Insufficient documentation

## 2018-09-01 NOTE — Assessment & Plan Note (Signed)
He had seen outside clinic re: HCG re: weight loss.  I asked him to get me a copy of the outside labs and reports.  I asked him to check with that clinic about monitoring, etc.  See f/u phone note re: HCG

## 2018-09-01 NOTE — Assessment & Plan Note (Signed)
  Tetanus done more than 5 but less than 10 years ago.   Flu encouraged.   PNA d/w pt.    Shingles not due.  PSA and colon cancer screening not due.  Living will d/w pt. Wife designated if patient were incapacitated.  Diet and exercise d/w pt.

## 2018-09-01 NOTE — Assessment & Plan Note (Signed)
Living will d/w pt.  Wife designated if patient were incapacitated.   ?

## 2018-09-01 NOTE — Assessment & Plan Note (Signed)
See f/u phone note about vaccination.

## 2018-09-01 NOTE — Assessment & Plan Note (Signed)
Mood d/w pt.  Anxiety clearly better.  No ADE on med.  No SI/HI.

## 2018-09-01 NOTE — Assessment & Plan Note (Signed)
Will set up A1c in about 3-4 months.  Needs low carb diet and weight loss, exercise.  D/w pt about dx and plan.

## 2018-09-01 NOTE — Assessment & Plan Note (Signed)
Still on CPAP.  He feels better with use.  No air leaks.  Continue as is.  D/w pt about weight loss.

## 2018-09-01 NOTE — Telephone Encounter (Signed)
Call pt.   Several randomized trials have shown that the hCG diet is not more effective than placebo in the treatment of obesity.  I wouldn't take it for weight loss.    Given that he had his spleen removed, he is due for several vaccines. I pasted the schedule below.   The prev issue about his potentially not needing PNA vaccine if he were not diabetic doesn't apply.  He should be vaccinated given the h/o splenectomy.   I also marked a copy of the eat right/low carb diet for patient. Please send to him.  Thanks.   Thanks.  Pneumococcal vaccine PCV13 (Prevnar) plus PPSV23 (Pneumovax) 1 dose of PCV13 followed by 1 dose of PPSV23 ?8 weeks later   Haemophilus influenzae type b vaccine Hib 1 dose   Meningococcal serotype ACWY vaccine MenACWY (Menactra or Menveo) 2 doses at least 8 weeks apart   Meningococcal serotype B MenB-FHbp ([place]) or MenB-4C (Bexsero) 2 doses of MenB-4C at least 1 month apart or 3 doses of MenB-FHbp at 0, 1 to 2, and 6 months

## 2018-09-02 NOTE — Telephone Encounter (Signed)
Patient advised and scheduled a nurse visit for vaccines on 09/05/2018,  Please fill out a vaccine request form for the nurse visit.  Diet information mailed to patient.

## 2018-09-03 NOTE — Telephone Encounter (Signed)
Form given to Vallarie Mare who will be doing nurse visits on Thursday afternoon when the patient is scheduled.

## 2018-09-03 NOTE — Telephone Encounter (Signed)
I filled out the vaccine order form.  He can get Menveo, Bexsero, Prevnar on the first round.  He can get  2nd Menveo, 2nd Bexsero, Hib, and Pneumovax at least 2 months later.  Thanks.

## 2018-09-05 ENCOUNTER — Other Ambulatory Visit: Payer: Self-pay

## 2018-09-05 ENCOUNTER — Ambulatory Visit (INDEPENDENT_AMBULATORY_CARE_PROVIDER_SITE_OTHER): Payer: BC Managed Care – PPO | Admitting: *Deleted

## 2018-09-05 DIAGNOSIS — Z23 Encounter for immunization: Secondary | ICD-10-CM

## 2018-09-06 NOTE — Progress Notes (Signed)
He can get  2nd Menveo, 2nd Bexsero, Hib, and Pneumovax at [place] on 12/19/2018

## 2018-09-08 ENCOUNTER — Telehealth: Payer: Self-pay | Admitting: Family Medicine

## 2018-09-08 DIAGNOSIS — R7989 Other specified abnormal findings of blood chemistry: Secondary | ICD-10-CM

## 2018-09-08 NOTE — Telephone Encounter (Addendum)
Outside records reviewed.  Labs collected by bariatric clinic.  He had abnormal CBC with white count elevated at 19 and platelets elevated at 585.  Did the outside clinic contact him and tell him he had an abnormal CBC that needed work-up? He needs follow-up CBC and path review.  Ordered.  Needs lab visit.  Thanks.

## 2018-09-09 NOTE — Telephone Encounter (Signed)
Patient advised.  Lab appt scheduled.  

## 2018-09-11 ENCOUNTER — Other Ambulatory Visit (INDEPENDENT_AMBULATORY_CARE_PROVIDER_SITE_OTHER): Payer: BC Managed Care – PPO

## 2018-09-11 DIAGNOSIS — R7989 Other specified abnormal findings of blood chemistry: Secondary | ICD-10-CM

## 2018-09-11 NOTE — Addendum Note (Signed)
Addended by: Ellamae Sia on: 09/11/2018 08:02 AM   Modules accepted: Orders

## 2018-09-12 LAB — CBC WITH DIFFERENTIAL/PLATELET
Absolute Monocytes: 1290 cells/uL — ABNORMAL HIGH (ref 200–950)
Basophils Absolute: 136 cells/uL (ref 0–200)
Basophils Relative: 1.1 %
Eosinophils Absolute: 446 cells/uL (ref 15–500)
Eosinophils Relative: 3.6 %
HCT: 43.8 % (ref 38.5–50.0)
Hemoglobin: 15 g/dL (ref 13.2–17.1)
Lymphs Abs: 2430 cells/uL (ref 850–3900)
MCH: 28.8 pg (ref 27.0–33.0)
MCHC: 34.2 g/dL (ref 32.0–36.0)
MCV: 84.2 fL (ref 80.0–100.0)
MPV: 12.2 fL (ref 7.5–12.5)
Monocytes Relative: 10.4 %
Neutro Abs: 8097 cells/uL — ABNORMAL HIGH (ref 1500–7800)
Neutrophils Relative %: 65.3 %
Platelets: 524 10*3/uL — ABNORMAL HIGH (ref 140–400)
RBC: 5.2 10*6/uL (ref 4.20–5.80)
RDW: 13.2 % (ref 11.0–15.0)
Total Lymphocyte: 19.6 %
WBC: 12.4 10*3/uL — ABNORMAL HIGH (ref 3.8–10.8)

## 2018-09-12 LAB — PATHOLOGIST SMEAR REVIEW

## 2018-09-16 ENCOUNTER — Other Ambulatory Visit: Payer: Self-pay | Admitting: Family Medicine

## 2018-09-16 DIAGNOSIS — R7989 Other specified abnormal findings of blood chemistry: Secondary | ICD-10-CM

## 2018-09-16 DIAGNOSIS — D72829 Elevated white blood cell count, unspecified: Secondary | ICD-10-CM

## 2018-09-26 ENCOUNTER — Other Ambulatory Visit: Payer: Self-pay

## 2018-09-27 ENCOUNTER — Encounter: Payer: Self-pay | Admitting: Oncology

## 2018-09-27 ENCOUNTER — Inpatient Hospital Stay: Payer: BC Managed Care – PPO | Attending: Oncology | Admitting: Oncology

## 2018-09-27 DIAGNOSIS — D473 Essential (hemorrhagic) thrombocythemia: Secondary | ICD-10-CM | POA: Insufficient documentation

## 2018-09-27 DIAGNOSIS — D729 Disorder of white blood cells, unspecified: Secondary | ICD-10-CM | POA: Diagnosis not present

## 2018-09-27 DIAGNOSIS — D7589 Other specified diseases of blood and blood-forming organs: Secondary | ICD-10-CM | POA: Diagnosis not present

## 2018-09-27 DIAGNOSIS — D72829 Elevated white blood cell count, unspecified: Secondary | ICD-10-CM | POA: Insufficient documentation

## 2018-09-27 DIAGNOSIS — D72821 Monocytosis (symptomatic): Secondary | ICD-10-CM

## 2018-09-27 DIAGNOSIS — D75839 Thrombocytosis, unspecified: Secondary | ICD-10-CM

## 2018-09-27 NOTE — Progress Notes (Signed)
Called patient to do assessment for doxiimity appt.  Pt expecting MD's call this afternoon.

## 2018-09-30 ENCOUNTER — Encounter: Payer: Self-pay | Admitting: Oncology

## 2018-09-30 NOTE — Progress Notes (Signed)
I connected with [name] on 09/30/18 at  1:30 PM EDT by video enabled telemedicine visit and verified that I am speaking with the correct person using two identifiers.   I discussed the limitations, risks, security and privacy concerns of performing an evaluation and management service by telemedicine and the availability of in-person appointments. I also discussed with the patient that there may be a patient responsible charge related to this service. The patient expressed understanding and agreed to proceed.  Other persons participating in the visit and their role in the encounter:  none  Patient's location:  car Provider's location:  Work  Reason for referral: Thrombocytosis Referring provider: Dr. Damita Dunnings.   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  Most recent CBC from 09/11/2018 showed a white count of 12.4, H&H of 15/43.8 and a platelet count of 524.  Of note patient has had chronic leukocytosis and his white count fluctuates between 12-22 and his platelet counts have remained in the 500s.  Differential on the CBC mainly shows neutrophilia with occasional lymphocytosis and monocytosis.  Patient states he is a non-smoker.  His other medical problems include diabetes and obstructive sleep apnea.  Overall he feels well and his appetite and weight are stable.  Denies any unintentional weight loss or drenching night sweats.  Denies any thromboembolic episodes.    Review of Systems  Constitutional: Negative for chills, fever, malaise/fatigue and weight loss.  HENT: Negative for congestion, ear discharge and nosebleeds.   Eyes: Negative for blurred vision.  Respiratory: Negative for cough, hemoptysis, sputum production, shortness of breath and wheezing.   Cardiovascular: Negative for chest pain, palpitations, orthopnea and  claudication.  Gastrointestinal: Negative for abdominal pain, blood in stool, constipation, diarrhea, heartburn, melena, nausea and vomiting.  Genitourinary: Negative for dysuria, flank pain, frequency, hematuria and urgency.  Musculoskeletal: Negative for back pain, joint pain and myalgias.  Skin: Negative for rash.  Neurological: Negative for dizziness, tingling, focal weakness, seizures, weakness and headaches.  Endo/Heme/Allergies: Does not bruise/bleed easily.  Psychiatric/Behavioral: Negative for depression and suicidal ideas. The patient does not have insomnia.     Allergies  Allergen Reactions  . Aspirin     Nosebleeds    Past Medical History:  Diagnosis Date  . Allergy   . Anemia    hereditary spherocytosis, resolved after splenectomy in childhood per patient  . Diabetes mellitus without complication [facility])     Past Surgical History:  Procedure Laterality Date  . CARPAL TUNNEL RELEASE     bilateral  . PLANTAR FASCIA SURGERY Left   . SPLENECTOMY, TOTAL      Social History   Socioeconomic History  . Marital status: Married    Spouse name: Not on file  . Number of children: Not on file  . Years of education: Not on file  . Highest education level: Not on file  Occupational History  . Not on file  Social Needs  . Financial resource strain: Not on file  . Food insecurity    Worry: Not on file    Inability: Not on file  . Transportation needs    Medical: Not on file    Non-medical: Not on file  Tobacco Use  . Smoking status: Former Smoker    Packs/day: 1.00    Years: 17.00    Pack years: 17.00    Types: Cigarettes  .  Smokeless tobacco: Current User    Types: Snuff  . Tobacco comment: since 02/2013  Substance and Sexual Activity  . Alcohol use: No    Alcohol/week: 0.0 standard drinks  . Drug use: No  . Sexual activity: Yes  Lifestyle  . Physical activity    Days per week: Not on file    Minutes per session: Not on file  . Stress: Not on file   Relationships  . Social Herbalist on phone: Not on file    Gets together: Not on file    Attends religious service: Not on file    Active member of club or organization: Not on file    Attends meetings of clubs or organizations: Not on file    Relationship status: Not on file  . Intimate partner violence    Fear of current or ex partner: Not on file    Emotionally abused: Not on file    Physically abused: Not on file    Forced sexual activity: Not on file  Other Topics Concern  . Not on file  Social History Narrative   Married 2009, 2 step kids   Warehouse work as of 2018    Family History  Problem Relation Age of Onset  . Arthritis Mother   . Diabetes Mother   . Hypertension Mother   . Hyperlipidemia Mother   . Heart disease Mother   . Arthritis Father   . Diabetes Father   . Anemia Maternal Grandmother   . Colon cancer Neg Hx   . Prostate cancer Neg Hx      Current Outpatient Medications:  .  escitalopram (LEXAPRO) 10 MG tablet, Take 1 tablet (10 mg total) by mouth daily., Disp: 90 tablet, Rfl: 3  No results found.  No images are attached to the encounter.   CMP Latest Ref Rng & Units 08/23/2018  Glucose 70 - 99 mg/dL 198(H)  BUN 6 - 23 mg/dL 19  Creatinine 0.40 - 1.50 mg/dL 0.81  Sodium 135 - 145 mEq/L 137  Potassium 3.5 - 5.1 mEq/L 4.9  Chloride 96 - 112 mEq/L 101  CO2 19 - 32 mEq/L 29  Calcium 8.4 - 10.5 mg/dL 9.4  Total Protein 6.0 - 8.3 g/dL 6.9  Total Bilirubin 0.2 - 1.2 mg/dL 0.3  Alkaline Phos 39 - 117 U/L 91  AST 0 - 37 U/L 13  ALT 0 - 53 U/L 20   CBC Latest Ref Rng & Units 09/11/2018  WBC 3.8 - 10.8 Thousand/uL 12.4(H)  Hemoglobin 13.2 - 17.1 g/dL 15.0  Hematocrit 38.5 - 50.0 % 43.8  Platelets 140 - 400 Thousand/uL 524(H)     Observation/objective: Appears in no acute distress of a video visit today.  Breathing is nonlabored  Assessment and plan: Patient is a 43 year old male with a prior history of hereditary spherocytosis  status post splenectomy as a child referred for leukocytosis and thrombocytosis.  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  I will check a CBC with differential, smear review, flow cytometry and BCR ABL FISH testing next week.  Video visit with MD in 2 weeks time  Follow-up instructions: Labs within 1 week.  Video visit with MD in 2 weeks  I discussed the assessment and treatment plan with the patient. The patient was provided an opportunity to ask questions and all were answered. The patient agreed with the plan and demonstrated an understanding of the instructions.   The patient was advised to call  back or seek an in-person evaluation if the symptoms worsen or if the condition fails to improve as anticipated.    Visit Diagnosis: 1. Neutrophilia   2. Monocytosis   3. Thrombocytosis ([place])     Dr. Randa Evens, MD, MPH [facility] at [facility] [phone] 09/30/2018 12:33 PM

## 2018-10-01 ENCOUNTER — Encounter: Payer: Self-pay | Admitting: Family Medicine

## 2018-10-01 LAB — LAB REPORT - SCANNED
AST: 20
Cholesterol: 159
Creatinine, Ser: 0.83
GGT: 19
Glucose: 146
HDL: 23
Hemoglobin: 14.8
LDL (calc): 60
Platelets: 585
TSH: 1.3
Triglycerides: 380 — AB (ref 40–160)
Uric Acid: 6.9
WBC: 19

## 2018-10-09 ENCOUNTER — Other Ambulatory Visit: Payer: Self-pay

## 2018-10-09 ENCOUNTER — Inpatient Hospital Stay: Payer: BC Managed Care – PPO

## 2018-10-09 DIAGNOSIS — D729 Disorder of white blood cells, unspecified: Secondary | ICD-10-CM

## 2018-10-09 DIAGNOSIS — D473 Essential (hemorrhagic) thrombocythemia: Secondary | ICD-10-CM | POA: Diagnosis not present

## 2018-10-09 DIAGNOSIS — D72829 Elevated white blood cell count, unspecified: Secondary | ICD-10-CM | POA: Diagnosis not present

## 2018-10-09 DIAGNOSIS — D72821 Monocytosis (symptomatic): Secondary | ICD-10-CM

## 2018-10-09 LAB — CBC WITH DIFFERENTIAL/PLATELET
Abs Immature Granulocytes: 0.07 10*3/uL (ref 0.00–0.07)
Basophils Absolute: 0.1 10*3/uL (ref 0.0–0.1)
Basophils Relative: 1 %
Eosinophils Absolute: 0.4 10*3/uL (ref 0.0–0.5)
Eosinophils Relative: 2 %
HCT: 41.8 % (ref 39.0–52.0)
Hemoglobin: 14.4 g/dL (ref 13.0–17.0)
Immature Granulocytes: 0 %
Lymphocytes Relative: 20 %
Lymphs Abs: 3.5 10*3/uL (ref 0.7–4.0)
MCH: 29 pg (ref 26.0–34.0)
MCHC: 34.4 g/dL (ref 30.0–36.0)
MCV: 84.1 fL (ref 80.0–100.0)
Monocytes Absolute: 2.4 10*3/uL — ABNORMAL HIGH (ref 0.1–1.0)
Monocytes Relative: 14 %
Neutro Abs: 11.3 10*3/uL — ABNORMAL HIGH (ref 1.7–7.7)
Neutrophils Relative %: 63 %
Platelets: 581 10*3/uL — ABNORMAL HIGH (ref 150–400)
RBC: 4.97 MIL/uL (ref 4.22–5.81)
RDW: 13.2 % (ref 11.5–15.5)
WBC: 17.8 10*3/uL — ABNORMAL HIGH (ref 4.0–10.5)
nRBC: 0 % (ref 0.0–0.2)

## 2018-10-09 LAB — TECHNOLOGIST SMEAR REVIEW

## 2018-10-11 LAB — COMP PANEL: LEUKEMIA/LYMPHOMA

## 2018-10-17 ENCOUNTER — Other Ambulatory Visit: Payer: Self-pay

## 2018-10-17 LAB — BCR-ABL1 FISH
Cells Analyzed: 200
Cells Counted: 200

## 2018-10-18 ENCOUNTER — Encounter: Payer: Self-pay | Admitting: Oncology

## 2018-10-18 ENCOUNTER — Inpatient Hospital Stay (HOSPITAL_BASED_OUTPATIENT_CLINIC_OR_DEPARTMENT_OTHER): Payer: BC Managed Care – PPO | Admitting: Oncology

## 2018-10-18 ENCOUNTER — Other Ambulatory Visit: Payer: Self-pay | Admitting: *Deleted

## 2018-10-18 DIAGNOSIS — D473 Essential (hemorrhagic) thrombocythemia: Secondary | ICD-10-CM | POA: Diagnosis not present

## 2018-10-18 DIAGNOSIS — D75839 Thrombocytosis, unspecified: Secondary | ICD-10-CM

## 2018-10-18 DIAGNOSIS — D72829 Elevated white blood cell count, unspecified: Secondary | ICD-10-CM

## 2018-10-18 NOTE — Progress Notes (Signed)
Patient stated that he had been doing well with no complaints. 

## 2018-10-20 NOTE — Progress Notes (Signed)
I connected with [name] on 10/20/18 at 10:15 AM EDT by video enabled telemedicine visit and verified that I am speaking with the correct person using two identifiers.   I discussed the limitations, risks, security and privacy concerns of performing an evaluation and management service by telemedicine and the availability of in-person appointments. I also discussed with the patient that there may be a patient responsible charge related to this service. The patient expressed understanding and agreed to proceed.  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  Most recent CBC from 09/11/2018 showed a white count of 12.4, H&H of 15/43.8 and a platelet count of 524.  Of note patient has had chronic leukocytosis and his white count fluctuates between 12-22 and his platelet counts have remained in the 500s.  Differential on the CBC mainly shows neutrophilia with occasional lymphocytosis and monocytosis.  Patient states he is a non-smoker.  His other medical problems include diabetes and obstructive sleep apnea.  Overall he feels well and his appetite and weight are stable.  Denies any unintentional weight loss or drenching night sweats.  Denies any thromboembolic episodes.  Interval history: feels well. Denies any complaints   Review of Systems  Constitutional: Negative for chills, fever, malaise/fatigue and weight loss.  HENT: Negative for congestion, ear discharge and nosebleeds.   Eyes: Negative for blurred vision.  Respiratory: Negative for cough, hemoptysis, sputum production, shortness  of breath and wheezing.   Cardiovascular: Negative for chest pain, palpitations, orthopnea and claudication.  Gastrointestinal: Negative for abdominal pain, blood in stool, constipation, diarrhea, heartburn, melena, nausea and vomiting.  Genitourinary: Negative for dysuria, flank pain, frequency, hematuria and urgency.  Musculoskeletal: Negative for back pain, joint pain and myalgias.  Skin: Negative for rash.  Neurological: Negative for dizziness, tingling, focal weakness, seizures, weakness and headaches.  Endo/Heme/Allergies: Does not bruise/bleed easily.  Psychiatric/Behavioral: Negative for depression and suicidal ideas. The patient does not have insomnia.     Allergies  Allergen Reactions  . Aspirin     Nosebleeds    Past Medical History:  Diagnosis Date  . Allergy   . Anemia    hereditary spherocytosis, resolved after splenectomy in childhood per patient  . Diabetes mellitus without complication [facility])     Past Surgical History:  Procedure Laterality Date  . CARPAL TUNNEL RELEASE     bilateral  . PLANTAR FASCIA SURGERY Left   . SPLENECTOMY, TOTAL      Social History   Socioeconomic History  . Marital status: Married    Spouse name: Not on file  . Number of children: Not on file  . Years of education: Not on file  . Highest education level: Not on file  Occupational History  . Not on file  Social Needs  . Financial resource strain: Not on file  . Food insecurity    Worry: Not on file    Inability: Not on file  . Transportation needs    Medical: Not on file    Non-medical: Not on file  Tobacco Use  . Smoking status: Former Smoker    Packs/day: 1.00    Years: 17.00  Pack years: 17.00    Types: Cigarettes  . Smokeless tobacco: Current User    Types: Snuff  . Tobacco comment: since 02/2013  Substance and Sexual Activity  . Alcohol use: No    Alcohol/week: 0.0 standard drinks  . Drug use: No  . Sexual activity: Yes  Lifestyle  . Physical activity     Days per week: Not on file    Minutes per session: Not on file  . Stress: Not on file  Relationships  . Social Herbalist on phone: Not on file    Gets together: Not on file    Attends religious service: Not on file    Active member of club or organization: Not on file    Attends meetings of clubs or organizations: Not on file    Relationship status: Not on file  . Intimate partner violence    Fear of current or ex partner: Not on file    Emotionally abused: Not on file    Physically abused: Not on file    Forced sexual activity: Not on file  Other Topics Concern  . Not on file  Social History Narrative   Married 2009, 2 step kids   Warehouse work as of 2018    Family History  Problem Relation Age of Onset  . Arthritis Mother   . Diabetes Mother   . Hypertension Mother   . Hyperlipidemia Mother   . Heart disease Mother   . Arthritis Father   . Diabetes Father   . Anemia Maternal Grandmother   . Colon cancer Neg Hx   . Prostate cancer Neg Hx      Current Outpatient Medications:  .  escitalopram (LEXAPRO) 10 MG tablet, Take 1 tablet (10 mg total) by mouth daily., Disp: 90 tablet, Rfl: 3  No results found.  No images are attached to the encounter.   CMP Latest Ref Rng & Units 08/26/2018  Glucose 70 - 99 mg/dL -  BUN 6 - 23 mg/dL -  Creatinine - 0.83  Sodium 135 - 145 mEq/L -  Potassium 3.5 - 5.1 mEq/L -  Chloride 96 - 112 mEq/L -  CO2 19 - 32 mEq/L -  Calcium 8.4 - 10.5 mg/dL -  Total Protein 6.0 - 8.3 g/dL -  Total Bilirubin 0.2 - 1.2 mg/dL -  Alkaline Phos 39 - 117 U/L -  AST - 20  ALT 0 - 53 U/L -   CBC Latest Ref Rng & Units 10/09/2018  WBC 4.0 - 10.5 K/uL 17.8(H)  Hemoglobin 13.0 - 17.0 g/dL 14.4  Hematocrit 39.0 - 52.0 % 41.8  Platelets 150 - 400 K/uL 581(H)     Observation/objective: appears in no acute distress over video visit today. Breathing is non labored  Assessment and plan: Patient is a 43 year old male with a history of  [place] no cytosis s/p splenectomy referred for leukocytosis/thrombocytosis likely secondary to splenectomy.   I discussed the results of blood work with the patient which shows chronic Neutrophilia and thrombocytosis.  Patient's white count has been elevated since 2009 and fluctuates anywhere between 13-20.  Also his platelet counts have fluctuated mainly in the 500s.  Hemoglobin is normal.  BCR ABL FISH testing was negative.  Smear review was unremarkable and flow cytometry showed no significant immunophenotypic abnormality.  I suspect his leukocytosis and thrombocytosis is reactive secondary to splenectomy and can be monitored conservatively without the need for bone marrow biopsy.  There is a consistent rise in  his white count and/or platelet count I will consider a bone marrow biopsy at that time.  I will see him back in 6 months with a CBC with differential   I discussed the assessment and treatment plan with the patient. The patient was provided an opportunity to ask questions and all were answered. The patient agreed with the plan and demonstrated an understanding of the instructions.   The patient was advised to call back or seek an in-person evaluation if the symptoms worsen or if the condition fails to improve as anticipated.    Visit Diagnosis: 1. Leukocytosis, unspecified type   2. Thrombocytosis ([place])     Dr. Randa Evens, MD, MPH [facility] at [facility] Pager- [phone] 10/20/2018 11:32 AM

## 2018-12-12 ENCOUNTER — Other Ambulatory Visit: Payer: Self-pay | Admitting: Family Medicine

## 2018-12-12 ENCOUNTER — Other Ambulatory Visit: Payer: BC Managed Care – PPO

## 2018-12-12 DIAGNOSIS — E119 Type 2 diabetes mellitus without complications: Secondary | ICD-10-CM

## 2018-12-16 ENCOUNTER — Other Ambulatory Visit (INDEPENDENT_AMBULATORY_CARE_PROVIDER_SITE_OTHER): Payer: BC Managed Care – PPO

## 2018-12-16 ENCOUNTER — Other Ambulatory Visit: Payer: BC Managed Care – PPO

## 2018-12-16 ENCOUNTER — Other Ambulatory Visit: Payer: Self-pay

## 2018-12-16 DIAGNOSIS — E119 Type 2 diabetes mellitus without complications: Secondary | ICD-10-CM

## 2018-12-16 LAB — BASIC METABOLIC PANEL
BUN: 19 mg/dL (ref 6–23)
CO2: 28 mEq/L (ref 19–32)
Calcium: 10.1 mg/dL (ref 8.4–10.5)
Chloride: 103 mEq/L (ref 96–112)
Creatinine, Ser: 0.87 mg/dL (ref 0.40–1.50)
GFR: 95.42 mL/min (ref >60.00)
Glucose, Bld: 114 mg/dL — ABNORMAL HIGH (ref 70–99)
Potassium: 4.6 mEq/L (ref 3.5–5.1)
Sodium: 139 mEq/L (ref 135–145)

## 2018-12-16 LAB — MICROALBUMIN / CREATININE URINE RATIO
Creatinine,U: 146.3 mg/dL
Microalb Creat Ratio: 0.5 mg/g (ref 0.0–30.0)
Microalb, Ur: 0.7 mg/dL (ref 0.0–1.9)

## 2018-12-16 LAB — HEMOGLOBIN A1C: Hgb A1c MFr Bld: 5.9 % (ref 4.6–6.5)

## 2018-12-19 ENCOUNTER — Ambulatory Visit: Payer: BC Managed Care – PPO | Admitting: Family Medicine

## 2018-12-19 DIAGNOSIS — Z0289 Encounter for other administrative examinations: Secondary | ICD-10-CM

## 2018-12-24 ENCOUNTER — Ambulatory Visit: Payer: BC Managed Care – PPO | Admitting: Family Medicine

## 2018-12-24 ENCOUNTER — Encounter: Payer: Self-pay | Admitting: Family Medicine

## 2018-12-24 ENCOUNTER — Other Ambulatory Visit: Payer: Self-pay

## 2018-12-24 VITALS — BP 124/72 | HR 77 | Temp 97.3°F | Resp 18 | Wt 349.4 lb

## 2018-12-24 DIAGNOSIS — Z8639 Personal history of other endocrine, nutritional and metabolic disease: Secondary | ICD-10-CM

## 2018-12-24 DIAGNOSIS — Z23 Encounter for immunization: Secondary | ICD-10-CM

## 2018-12-24 DIAGNOSIS — Z9081 Acquired absence of spleen: Secondary | ICD-10-CM | POA: Diagnosis not present

## 2018-12-24 NOTE — Patient Instructions (Addendum)
Thanks for getting vaccinated today.  Up to date as of today for vaccines related to not having a spleen.   You are not diabetic by A1c!!! This is great.  As long as your weight is coming down gradually, we should just recheck your A1c yearly.   Update me as needed.  Take care.  Glad to see you.

## 2018-12-24 NOTE — Progress Notes (Signed)
Intentional weight loss.  On phentermine w/o BP elevation.  Discussed with patient about diet and exercise.  Hyperglycemia but not DM2 by A1c, d/w pt.  MALB neg. labs discussed with patient.  D/w pt about 2nd Menveo, 2nd Bexsero, Hib, and Pneumovax.  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  He opted in.  Meds, vitals, and allergies reviewed.   ROS: Per HPI unless specifically indicated in ROS section   GEN: nad, alert and oriented HEENT: ncat NECK: supple w/o LA CV: rrr.  PULM: ctab, no inc wob ABD: soft, +bs EXT: no edema SKIN: no acute rash

## 2018-12-25 NOTE — Assessment & Plan Note (Signed)
Improved with weight loss.  Not diabetic by A1c.  Microalbumin negative.  Continue work on diet and exercise.  Assuming he has continued gradual weight loss I would expect his sugar to improve and we can just recheck periodically.  He agrees.  He will update me as needed.

## 2018-12-25 NOTE — Assessment & Plan Note (Signed)
Follow-up vaccines done today.  This should complete his vaccination related to asplenic status, assuming there is no change in guidelines.  Discussed.  Flu shot done today.

## 2019-04-17 ENCOUNTER — Telehealth: Payer: Self-pay | Admitting: Oncology

## 2019-04-17 NOTE — Telephone Encounter (Signed)
Writer phoned patient to discuss changing appt on 04-21-19 to virtual visit. Patient stated that he wanted to cancel this appt as he felt that he did not need to be seen. Appts cancelled per patient's request and MD informed.

## 2019-04-21 ENCOUNTER — Inpatient Hospital Stay: Payer: BC Managed Care – PPO | Admitting: Oncology

## 2019-04-21 ENCOUNTER — Inpatient Hospital Stay: Payer: BC Managed Care – PPO

## 2019-05-29 ENCOUNTER — Other Ambulatory Visit: Payer: Self-pay | Admitting: Family Medicine

## 2019-05-29 NOTE — Telephone Encounter (Signed)
Pt's wife called to request a refill for hydroxyzine. She was unsure if he would need an appointment in order to get a refill since it has been a while since he took it. Please advise.

## 2019-05-29 NOTE — Telephone Encounter (Signed)
Electronic refill request. Hydroxyzine Last office visit:   12/24/2018 Last Filled:   #100  2 RF  On 10/20/2016 Please advise.

## 2019-05-30 MED ORDER — HYDROXYZINE HCL 10 MG PO TABS: ORAL_TABLET | ORAL | 1 refills | Status: DC

## 2019-05-30 NOTE — Telephone Encounter (Addendum)
Sent. Thanks.  Please update [place] if not feeling better on medication.

## 2019-09-20 ENCOUNTER — Other Ambulatory Visit: Payer: Self-pay | Admitting: Family Medicine

## 2019-09-23 NOTE — Telephone Encounter (Signed)
Electronic refill request. Hydroxyzine Last office visit:   12/24/2018 Last Filled:    100 tablet 1 05/30/2019  Please advise.

## 2019-09-24 NOTE — Telephone Encounter (Signed)
Sent. Thanks.   

## 2019-10-31 ENCOUNTER — Ambulatory Visit: Payer: BC Managed Care – PPO | Admitting: Family Medicine

## 2019-10-31 ENCOUNTER — Other Ambulatory Visit: Payer: Self-pay

## 2019-10-31 ENCOUNTER — Encounter: Payer: Self-pay | Admitting: Family Medicine

## 2019-10-31 ENCOUNTER — Ambulatory Visit (INDEPENDENT_AMBULATORY_CARE_PROVIDER_SITE_OTHER)
Admission: RE | Admit: 2019-10-31 | Discharge: 2019-10-31 | Disposition: A | Discharge status: Home / Self Care | Payer: BC Managed Care – PPO | Source: Ambulatory Visit | Attending: Family Medicine | Admitting: Family Medicine

## 2019-10-31 VITALS — BP 132/72 | HR 83 | Temp 98.0°F | Ht 72.0 in | Wt 377.3 lb

## 2019-10-31 DIAGNOSIS — M545 Low back pain, unspecified: Secondary | ICD-10-CM

## 2019-10-31 LAB — POC URINALSYSI DIPSTICK (AUTOMATED)
Bilirubin, UA: NEGATIVE
Blood, UA: NEGATIVE
Glucose, UA: NEGATIVE
Ketones, UA: NEGATIVE
Leukocytes, UA: NEGATIVE
Nitrite, UA: NEGATIVE
Protein, UA: NEGATIVE
Spec Grav, UA: >=1.03 — AB (ref 1.010–1.025)
Urobilinogen, UA: 0.2 E.U./dL
pH, UA: 6 (ref 5.0–8.0)

## 2019-10-31 MED ORDER — GABAPENTIN 100 MG PO CAPS: 100.0000 mg | ORAL_CAPSULE | Freq: Three times a day (TID) | ORAL | 3 refills | Status: DC | PRN

## 2019-10-31 NOTE — Patient Instructions (Signed)
Go to the lab on the way out.   If you have mychart we'll likely use that to update you.    Try gradually increasing gabapentin and see if that helps with pain.  I would start with 1 tab at night.  If not helping then let me know.  Take care.  Glad to see you.

## 2019-10-31 NOTE — Progress Notes (Signed)
This visit occurred during the SARS-CoV-2 public health emergency.  Safety protocols were in place, including screening questions prior to the visit, additional usage of staff PPE, and extensive cleaning of exam room while observing appropriate contact time as indicated for disinfecting solutions.  Back pain.  "some days I can't hardly walk" due to pain.  Doing pressure washing recently with sig pain afterward.  He could still move with had pain with walking.  He has had episodic back pain over the years, worse recently.  Lower midline back pain.  He has h/o sciatica years ago, improved with chiropractor tx prev.    He drinks a lot of diet mtn dew, 4-6 per day, with caffeine.  Urine is not dark but not clear.  No burning with urination but he has some urgency.    No FCNAVD.  He doesn't feel unwell o/w but he has some fatigue.  He is busy with work.    Pain standing, not sitting. Pain worse standing and leaning forward.  Pain clearly better on sitting.  nsaids didn't help.    Meds, vitals, and allergies reviewed.   ROS: Per HPI unless specifically indicated in ROS section   nad ncat Neck supple, no LA rrr ctab abd sof, not ttp Lower back not ttp sitting down.  B SLR neg.   Strength and sensation grossly intact for the bilateral lower extremities. Able to bear weight.

## 2019-11-03 DIAGNOSIS — M545 Low back pain, unspecified: Secondary | ICD-10-CM | POA: Insufficient documentation

## 2019-11-03 NOTE — Assessment & Plan Note (Signed)
The concern is for spinal stenosis.  Discussed with patient.  Reasonable to check plain films today.  Discussed options of gabapentin He can try gradually increasing gabapentin and see if that helps with pain.  I would start with 1 tab at night.  If not helping then let me know.  He agrees.  See notes on imaging.

## 2019-11-04 ENCOUNTER — Other Ambulatory Visit: Payer: Self-pay | Admitting: Family Medicine

## 2019-11-04 MED ORDER — GABAPENTIN 100 MG PO CAPS: 300.0000 mg | ORAL_CAPSULE | Freq: Three times a day (TID) | ORAL | Status: DC | PRN

## 2019-11-16 ENCOUNTER — Other Ambulatory Visit: Payer: Self-pay | Admitting: Family Medicine

## 2019-12-17 ENCOUNTER — Other Ambulatory Visit: Payer: Self-pay | Admitting: Family Medicine

## 2019-12-18 NOTE — Telephone Encounter (Signed)
Electronic refill request. Gabapentin Last office visit:   10/31/2019 Last Filled:  #40  10/31/2019   3 RF

## 2019-12-19 NOTE — Telephone Encounter (Signed)
Sent. Thanks.   

## 2019-12-29 ENCOUNTER — Other Ambulatory Visit: Payer: Self-pay | Admitting: Family Medicine

## 2019-12-30 NOTE — Telephone Encounter (Signed)
Refill request Hydroxyzine Last refill 09/24/19 #100/1 Last office visit 10/31/19

## 2019-12-30 NOTE — Telephone Encounter (Signed)
Sent. Thanks.   

## 2020-03-18 DIAGNOSIS — Z20822 Contact with and (suspected) exposure to covid-19: Secondary | ICD-10-CM | POA: Diagnosis not present

## 2020-03-18 DIAGNOSIS — Z03818 Encounter for observation for suspected exposure to other biological agents ruled out: Secondary | ICD-10-CM | POA: Diagnosis not present

## 2020-03-18 DIAGNOSIS — U071 COVID-19: Secondary | ICD-10-CM | POA: Diagnosis not present

## 2020-05-10 ENCOUNTER — Other Ambulatory Visit: Payer: Self-pay | Admitting: Family Medicine

## 2020-07-12 ENCOUNTER — Other Ambulatory Visit: Payer: Self-pay | Admitting: Family Medicine

## 2020-07-12 NOTE — Telephone Encounter (Signed)
Refill request Atarax Last refill 05/10/20 #100/1 Last office visit 10/31/19

## 2020-07-13 NOTE — Telephone Encounter (Signed)
Sent. Thanks.   

## 2020-10-14 ENCOUNTER — Ambulatory Visit: Payer: BC Managed Care – PPO | Admitting: Family Medicine

## 2020-10-14 ENCOUNTER — Encounter: Payer: Self-pay | Admitting: Family Medicine

## 2020-10-14 ENCOUNTER — Other Ambulatory Visit: Payer: Self-pay

## 2020-10-14 ENCOUNTER — Telehealth: Payer: Self-pay

## 2020-10-14 VITALS — BP 110/70 | HR 74 | Temp 98.0°F | Ht 72.0 in | Wt 358.0 lb

## 2020-10-14 DIAGNOSIS — G8929 Other chronic pain: Secondary | ICD-10-CM

## 2020-10-14 DIAGNOSIS — M5136 Other intervertebral disc degeneration, lumbar region: Secondary | ICD-10-CM | POA: Diagnosis not present

## 2020-10-14 DIAGNOSIS — M549 Dorsalgia, unspecified: Secondary | ICD-10-CM

## 2020-10-14 DIAGNOSIS — M5416 Radiculopathy, lumbar region: Secondary | ICD-10-CM

## 2020-10-14 MED ORDER — PREDNISONE 20 MG PO TABS
ORAL_TABLET | ORAL | 0 refills | Status: DC
Start: 2020-10-14 — End: 2022-05-12

## 2020-10-14 MED ORDER — CYCLOBENZAPRINE HCL 10 MG PO TABS: 5.0000 mg | ORAL_TABLET | Freq: Every evening | ORAL | 1 refills | Status: DC | PRN

## 2020-10-14 NOTE — Telephone Encounter (Signed)
Per appt notes pt already has appt scheduled with Dr Lorelei Pont today at 9:40 am for lower back pain. Sending note to DR Copland as FYI.

## 2020-10-14 NOTE — Telephone Encounter (Signed)
[place] [facility] - Client Nonclinical Telephone Record AccessNurse Client [place] [facility] - Client Client Site [facility] Physician Renford Dills - MD Contact Type Call Who Is Calling Patient / Member / Family / Caregiver Caller Name [name] Caller Phone Number [phone] Patient Name [name] Patient DOB 01-Jun-1975 Call Type Message Only Information Provided Reason for Call Request to Schedule Office Appointment Initial Comment Caller wants to schedule appointment. Patient request to speak to RN No Additional Comment Office hours provided. Triage refused. Disp. Time Disposition Final User 10/14/2020 7:56:35 AM General Information Provided Yes Marshell Garfinkel Call Closed By: Marshell Garfinkel Transaction Date/Time: 10/14/2020 7:54:28 AM (ET)

## 2020-10-14 NOTE — Progress Notes (Signed)
Aianna Fahs T. Ramzi Brathwaite, MD, [facility] [place], [address]  Phone: [phone]  FAX: [phone]  [name] - 45 y.o. male  MRN [id number]  Date of Birth: 1975-09-14  Date: 10/14/2020  PCP: Tonia Ghent, MD  Referral: Tonia Ghent, MD  Chief Complaint  Patient presents with   Sciatica    Radiating down right leg    This visit occurred during the SARS-CoV-2 public health emergency.  Safety protocols were in place, including screening questions prior to the visit, additional usage of staff PPE, and extensive cleaning of exam room while observing appropriate contact time as indicated for disinfecting solutions.   Subjective:   [name] is a 66 y.o. very pleasant male patient with Body mass index is 48.55 kg/m. who presents with the following:  Patient has some back pain on the right side, and now this is going down his right leg, while he has had back pain before this is different.  This started 4 days ago and it is bothering him a great deal.  He has been using a heating pad.  He has noticed when he lies down that he will have some popping sensation. I have seen him before with bilateral low back pain as well as in 2019 with some lumbar radiculopathy.  No history of trauma or prior surgery.  Prior to this, he has never had any decreased sensation. He does not have any significant strength changes.  Heating pad, about it.   Doing somewhat better.   He works a lot, Works in [employer], pressure washing business. Every week.   Weekly  Lab Results  Component Value Date   HGBA1C 5.9 12/16/2018    R le lateral dec sensation  Review of Systems is noted in the HPI, as appropriate   Objective:   BP 110/70   Pulse 74   Temp 98 F (36.7 C) (Temporal)   Ht 6' (1.829 m)   Wt (!) 358 lb (162.4 kg)   SpO2 96%   BMI 48.55 kg/m    Range of motion at  the waist: Flexion,  extension, lateral bending and rotation: Forward flexion to 70 degrees, minimal extension, lateral bending and rotation are more preserved with slight loss of motion.  No echymosis or edema Rises to examination table with mild difficulty Gait: minimally antalgic  Inspection/Deformity: N Paraspinus Tenderness: L3-S1, right greater than left  B Ankle Dorsiflexion (L5,4): 5/5 B Great Toe Dorsiflexion (L5,4): 5/5 Heel Walk (L5): WNL Toe Walk (S1): WNL Rise/Squat (L4): WNL, mild pain  SENSORY Decreased sensation to touch as well as pinprick on the right lower extremity laterally with the exception of the foot.  REFLEXES Knee (L4): 2+ Ankle (S1): 2+  B SLR, seated: neg B SLR, supine: neg B FABER: neg B Reverse FABER: neg B Greater Troch: NT B Log Roll: neg B Sciatic Notch: NT   Radiology: No results found.  Assessment and Plan:     ICD-10-CM   1. Lumbar radiculopathy, acute  M54.16     2. Chronic back pain greater than 3 months duration  M54.9    G89.29     3. DDD (degenerative disc disease), lumbar  M51.36      Acute on chronic back pain with exacerbation to now include lumbar radiculopathy.  He also does have some lower extremity with decreased sensation on the right.  Basic range of motion, steroids, Flexeril as needed  at nighttime, and if his symptoms do continue after 3 to 4 weeks follow-up with me for additional work-up and management.  Meds ordered this encounter  Medications   cyclobenzaprine (FLEXERIL) 10 MG tablet    Sig: Take 0.5-1 tablets (5-10 mg total) by mouth at bedtime as needed for muscle spasms.    Dispense:  30 tablet    Refill:  1   predniSONE (DELTASONE) 20 MG tablet    Sig: 2 tabs po daily for 5 days, then 1 tab po daily for 5 days    Dispense:  15 tablet    Refill:  0   Medications Discontinued During This Encounter  Medication Reason   gabapentin (NEURONTIN) 100 MG capsule Completed Course   escitalopram (LEXAPRO) 10 MG tablet Patient  Preference    Signed,  Tannie Koskela T. Sherika Kubicki, MD   Outpatient Encounter Medications as of 10/14/2020  Medication Sig   cyclobenzaprine (FLEXERIL) 10 MG tablet Take 0.5-1 tablets (5-10 mg total) by mouth at bedtime as needed for muscle spasms.   hydrOXYzine (ATARAX/VISTARIL) 10 MG tablet TAKE 1 TO 2 TABLETS BY MOUTH 3 TIMES A DAY AS NEEDED FOR ANXIETY   predniSONE (DELTASONE) 20 MG tablet 2 tabs po daily for 5 days, then 1 tab po daily for 5 days   [DISCONTINUED] escitalopram (LEXAPRO) 10 MG tablet TAKE 1 TABLET BY MOUTH EVERY DAY   [DISCONTINUED] gabapentin (NEURONTIN) 100 MG capsule TAKE 1-2 CAPSULES (100-200 MG TOTAL) BY MOUTH 3 (THREE) TIMES DAILY AS NEEDED.   No facility-administered encounter medications on file as of 10/14/2020.

## 2020-11-03 ENCOUNTER — Other Ambulatory Visit: Payer: Self-pay | Admitting: Family Medicine

## 2021-04-04 DIAGNOSIS — J069 Acute upper respiratory infection, unspecified: Secondary | ICD-10-CM | POA: Diagnosis not present

## 2021-04-04 DIAGNOSIS — R059 Cough, unspecified: Secondary | ICD-10-CM | POA: Diagnosis not present

## 2021-04-04 DIAGNOSIS — Z20822 Contact with and (suspected) exposure to covid-19: Secondary | ICD-10-CM | POA: Diagnosis not present

## 2021-04-04 DIAGNOSIS — J309 Allergic rhinitis, unspecified: Secondary | ICD-10-CM | POA: Diagnosis not present

## 2021-04-14 DIAGNOSIS — M1711 Unilateral primary osteoarthritis, right knee: Secondary | ICD-10-CM | POA: Diagnosis not present

## 2021-05-03 IMAGING — DX LUMBAR SPINE - COMPLETE 4+ VIEW
4 series · 4 of 4 positions shown · non-contrast
Comparison: None.

CLINICAL DATA: Low back pain

EXAM:
LUMBAR SPINE - COMPLETE 4+ VIEW

[l-spine ap]
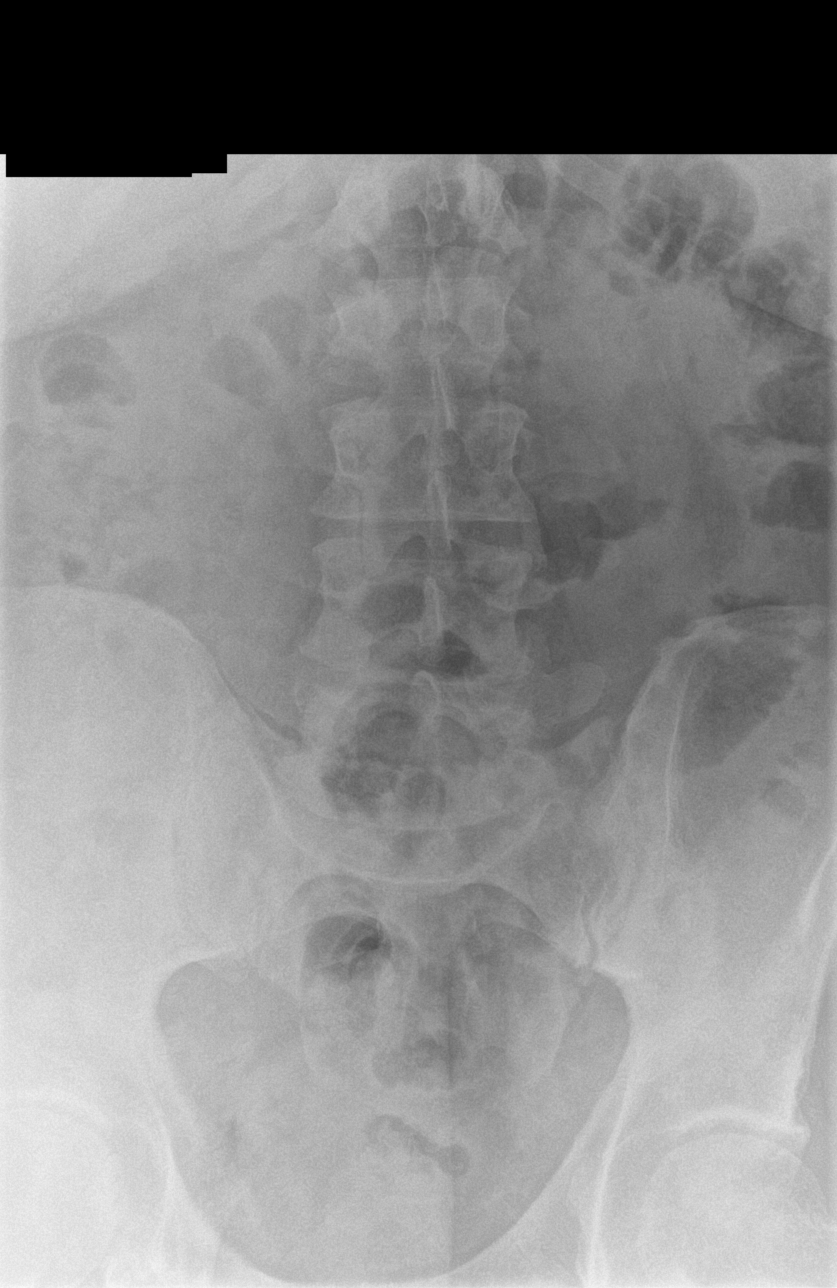

[l-spine obl (1 of 2)]
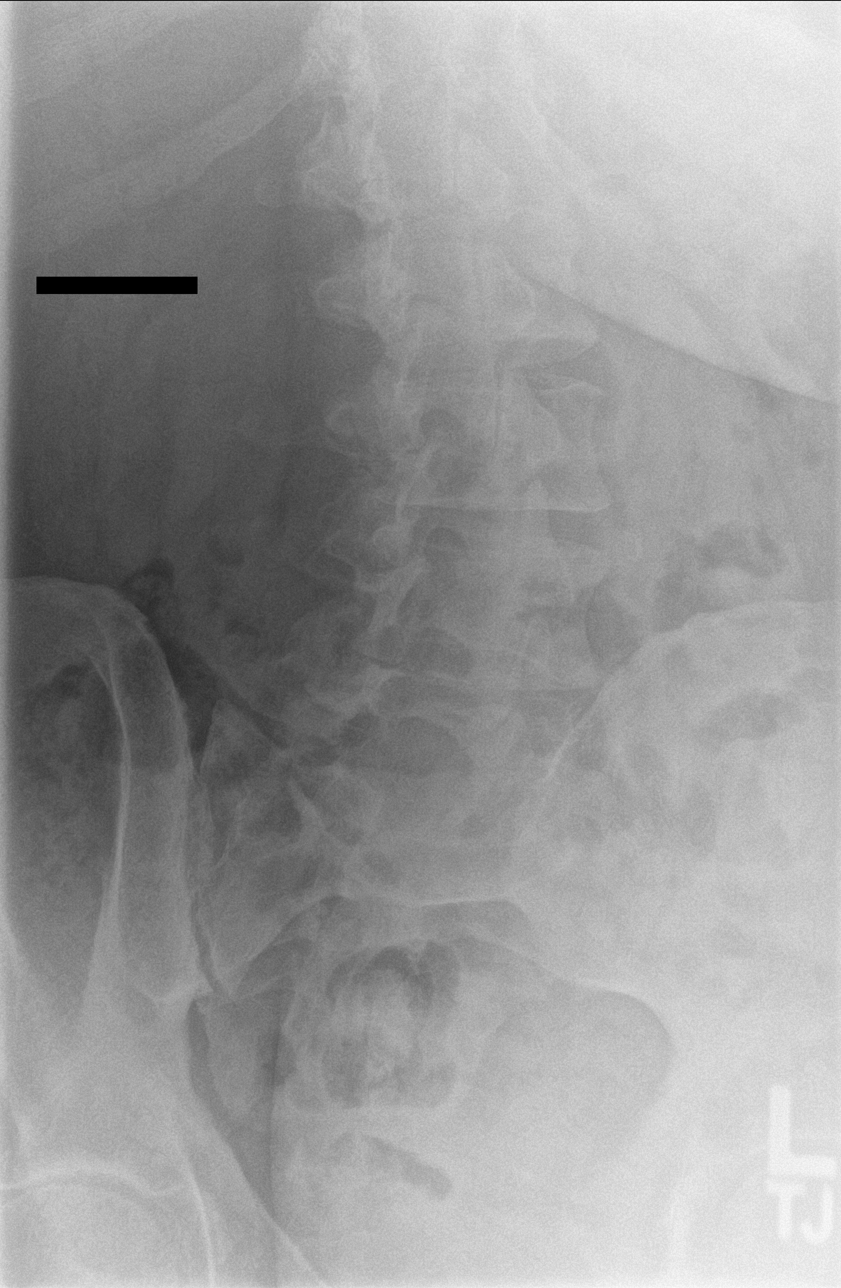

[l-spine obl (2 of 2)]
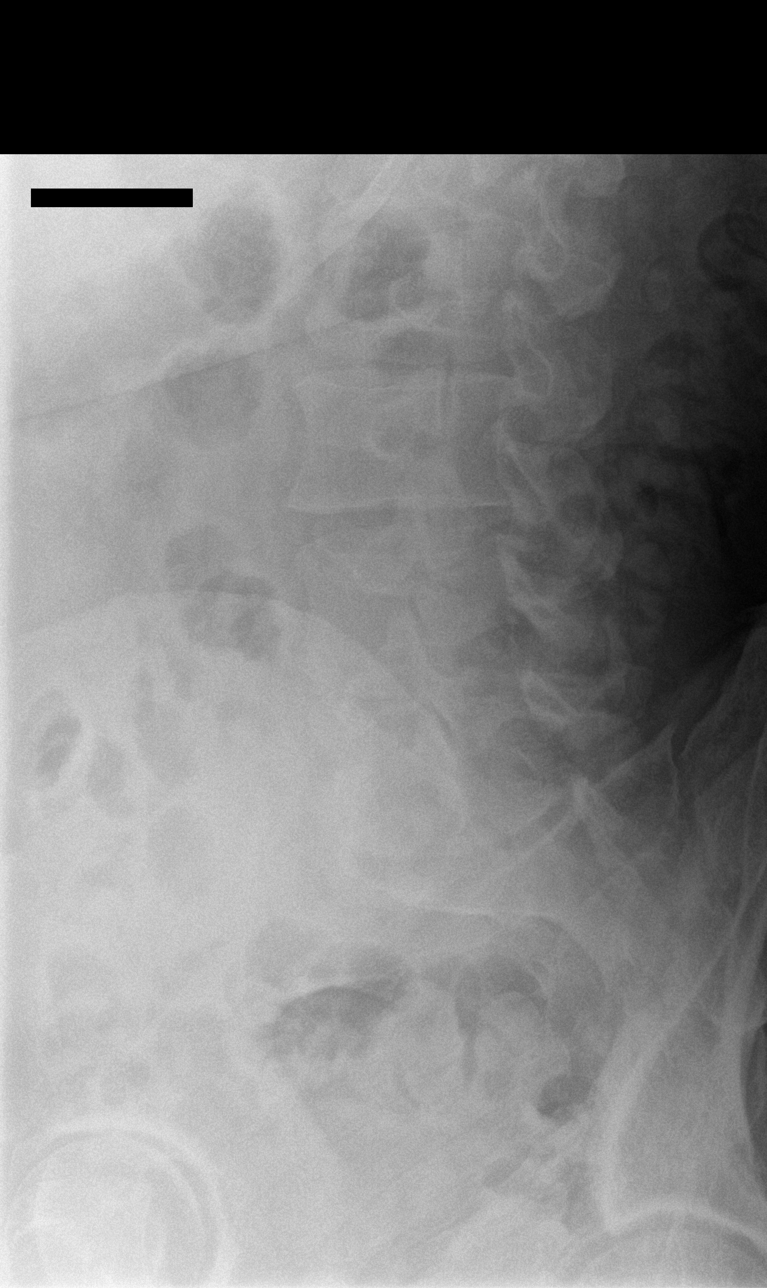

[l-spine lat]
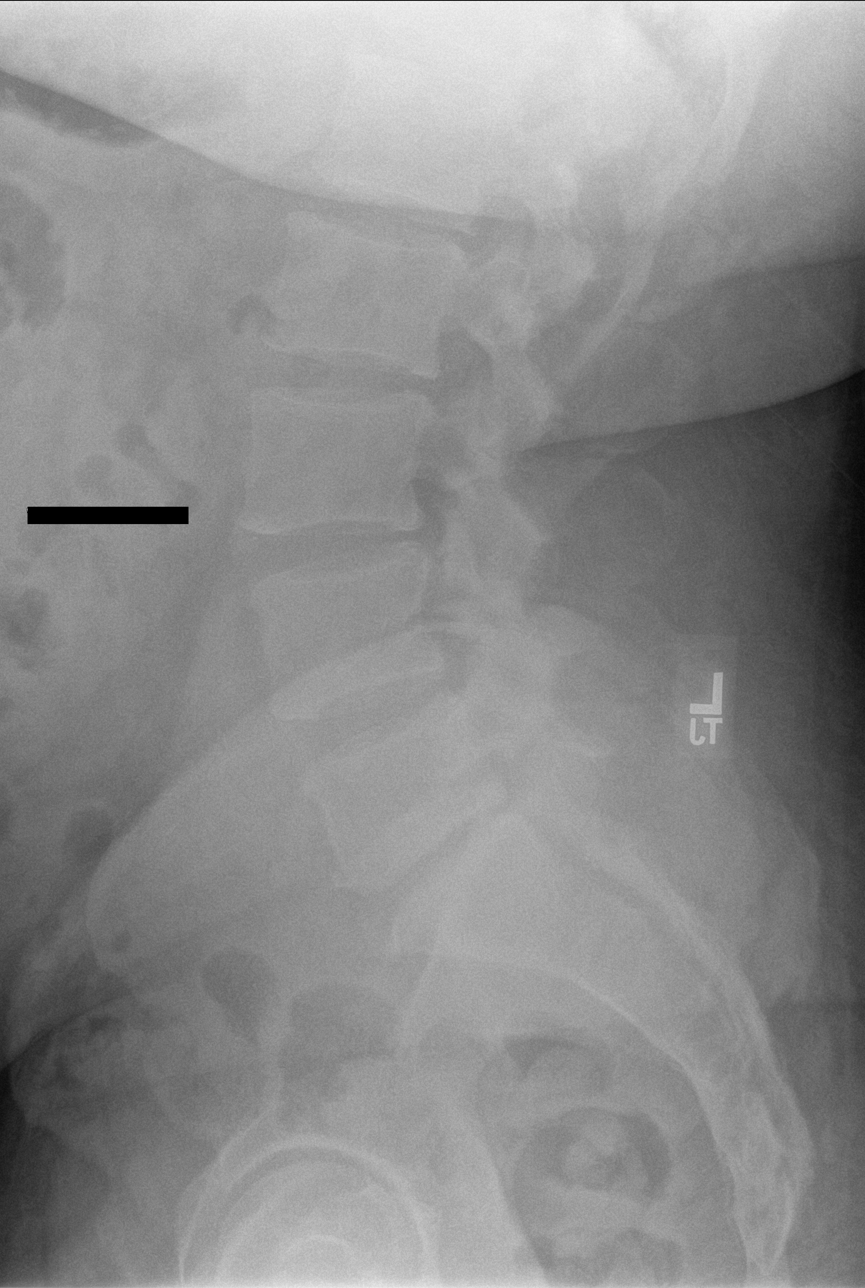

[4 of 4 positions shown; findings below may reference images not displayed]

FINDINGS: Degenerative disc disease at L5-S1 with disc space narrowing and
early anterior spurring. Mild degenerative facet disease throughout
the lumbar spine. Normal alignment. No fracture. SI joints symmetric
and unremarkable.
IMPRESSION: Degenerative disc and facet disease as above. No acute bony
abnormality.

## 2021-05-25 DIAGNOSIS — M955 Acquired deformity of pelvis: Secondary | ICD-10-CM | POA: Diagnosis not present

## 2021-05-25 DIAGNOSIS — M5416 Radiculopathy, lumbar region: Secondary | ICD-10-CM | POA: Diagnosis not present

## 2021-05-25 DIAGNOSIS — M62830 Muscle spasm of back: Secondary | ICD-10-CM | POA: Diagnosis not present

## 2021-05-25 DIAGNOSIS — M9905 Segmental and somatic dysfunction of pelvic region: Secondary | ICD-10-CM | POA: Diagnosis not present

## 2021-05-25 DIAGNOSIS — M9903 Segmental and somatic dysfunction of lumbar region: Secondary | ICD-10-CM | POA: Diagnosis not present

## 2021-05-25 DIAGNOSIS — M9902 Segmental and somatic dysfunction of thoracic region: Secondary | ICD-10-CM | POA: Diagnosis not present

## 2021-05-26 DIAGNOSIS — M9903 Segmental and somatic dysfunction of lumbar region: Secondary | ICD-10-CM | POA: Diagnosis not present

## 2021-05-26 DIAGNOSIS — M955 Acquired deformity of pelvis: Secondary | ICD-10-CM | POA: Diagnosis not present

## 2021-05-26 DIAGNOSIS — M62830 Muscle spasm of back: Secondary | ICD-10-CM | POA: Diagnosis not present

## 2021-05-26 DIAGNOSIS — M9902 Segmental and somatic dysfunction of thoracic region: Secondary | ICD-10-CM | POA: Diagnosis not present

## 2021-05-26 DIAGNOSIS — M9905 Segmental and somatic dysfunction of pelvic region: Secondary | ICD-10-CM | POA: Diagnosis not present

## 2021-05-26 DIAGNOSIS — M5416 Radiculopathy, lumbar region: Secondary | ICD-10-CM | POA: Diagnosis not present

## 2021-05-30 DIAGNOSIS — M9903 Segmental and somatic dysfunction of lumbar region: Secondary | ICD-10-CM | POA: Diagnosis not present

## 2021-05-30 DIAGNOSIS — M9905 Segmental and somatic dysfunction of pelvic region: Secondary | ICD-10-CM | POA: Diagnosis not present

## 2021-05-30 DIAGNOSIS — M955 Acquired deformity of pelvis: Secondary | ICD-10-CM | POA: Diagnosis not present

## 2021-05-30 DIAGNOSIS — M5416 Radiculopathy, lumbar region: Secondary | ICD-10-CM | POA: Diagnosis not present

## 2021-05-30 DIAGNOSIS — M9902 Segmental and somatic dysfunction of thoracic region: Secondary | ICD-10-CM | POA: Diagnosis not present

## 2021-05-30 DIAGNOSIS — M62830 Muscle spasm of back: Secondary | ICD-10-CM | POA: Diagnosis not present

## 2021-06-01 DIAGNOSIS — M9902 Segmental and somatic dysfunction of thoracic region: Secondary | ICD-10-CM | POA: Diagnosis not present

## 2021-06-01 DIAGNOSIS — M955 Acquired deformity of pelvis: Secondary | ICD-10-CM | POA: Diagnosis not present

## 2021-06-01 DIAGNOSIS — M9905 Segmental and somatic dysfunction of pelvic region: Secondary | ICD-10-CM | POA: Diagnosis not present

## 2021-06-01 DIAGNOSIS — M62830 Muscle spasm of back: Secondary | ICD-10-CM | POA: Diagnosis not present

## 2021-06-01 DIAGNOSIS — M5416 Radiculopathy, lumbar region: Secondary | ICD-10-CM | POA: Diagnosis not present

## 2021-06-01 DIAGNOSIS — M9903 Segmental and somatic dysfunction of lumbar region: Secondary | ICD-10-CM | POA: Diagnosis not present

## 2021-06-06 DIAGNOSIS — M955 Acquired deformity of pelvis: Secondary | ICD-10-CM | POA: Diagnosis not present

## 2021-06-06 DIAGNOSIS — M5416 Radiculopathy, lumbar region: Secondary | ICD-10-CM | POA: Diagnosis not present

## 2021-06-06 DIAGNOSIS — M9905 Segmental and somatic dysfunction of pelvic region: Secondary | ICD-10-CM | POA: Diagnosis not present

## 2021-06-06 DIAGNOSIS — M62830 Muscle spasm of back: Secondary | ICD-10-CM | POA: Diagnosis not present

## 2021-06-06 DIAGNOSIS — M9903 Segmental and somatic dysfunction of lumbar region: Secondary | ICD-10-CM | POA: Diagnosis not present

## 2021-06-06 DIAGNOSIS — M9902 Segmental and somatic dysfunction of thoracic region: Secondary | ICD-10-CM | POA: Diagnosis not present

## 2021-06-08 DIAGNOSIS — M62830 Muscle spasm of back: Secondary | ICD-10-CM | POA: Diagnosis not present

## 2021-06-08 DIAGNOSIS — M9905 Segmental and somatic dysfunction of pelvic region: Secondary | ICD-10-CM | POA: Diagnosis not present

## 2021-06-08 DIAGNOSIS — M955 Acquired deformity of pelvis: Secondary | ICD-10-CM | POA: Diagnosis not present

## 2021-06-08 DIAGNOSIS — M9902 Segmental and somatic dysfunction of thoracic region: Secondary | ICD-10-CM | POA: Diagnosis not present

## 2021-06-08 DIAGNOSIS — M5416 Radiculopathy, lumbar region: Secondary | ICD-10-CM | POA: Diagnosis not present

## 2021-06-08 DIAGNOSIS — M9903 Segmental and somatic dysfunction of lumbar region: Secondary | ICD-10-CM | POA: Diagnosis not present

## 2021-06-10 DIAGNOSIS — M955 Acquired deformity of pelvis: Secondary | ICD-10-CM | POA: Diagnosis not present

## 2021-06-10 DIAGNOSIS — M9902 Segmental and somatic dysfunction of thoracic region: Secondary | ICD-10-CM | POA: Diagnosis not present

## 2021-06-10 DIAGNOSIS — M9903 Segmental and somatic dysfunction of lumbar region: Secondary | ICD-10-CM | POA: Diagnosis not present

## 2021-06-10 DIAGNOSIS — M5416 Radiculopathy, lumbar region: Secondary | ICD-10-CM | POA: Diagnosis not present

## 2021-06-10 DIAGNOSIS — M9905 Segmental and somatic dysfunction of pelvic region: Secondary | ICD-10-CM | POA: Diagnosis not present

## 2021-06-10 DIAGNOSIS — M62830 Muscle spasm of back: Secondary | ICD-10-CM | POA: Diagnosis not present

## 2021-06-14 DIAGNOSIS — M9905 Segmental and somatic dysfunction of pelvic region: Secondary | ICD-10-CM | POA: Diagnosis not present

## 2021-06-14 DIAGNOSIS — M9902 Segmental and somatic dysfunction of thoracic region: Secondary | ICD-10-CM | POA: Diagnosis not present

## 2021-06-14 DIAGNOSIS — M9903 Segmental and somatic dysfunction of lumbar region: Secondary | ICD-10-CM | POA: Diagnosis not present

## 2021-06-14 DIAGNOSIS — M955 Acquired deformity of pelvis: Secondary | ICD-10-CM | POA: Diagnosis not present

## 2021-06-14 DIAGNOSIS — M5416 Radiculopathy, lumbar region: Secondary | ICD-10-CM | POA: Diagnosis not present

## 2021-06-14 DIAGNOSIS — M62830 Muscle spasm of back: Secondary | ICD-10-CM | POA: Diagnosis not present

## 2021-06-16 DIAGNOSIS — M9905 Segmental and somatic dysfunction of pelvic region: Secondary | ICD-10-CM | POA: Diagnosis not present

## 2021-06-16 DIAGNOSIS — M9903 Segmental and somatic dysfunction of lumbar region: Secondary | ICD-10-CM | POA: Diagnosis not present

## 2021-06-16 DIAGNOSIS — M9902 Segmental and somatic dysfunction of thoracic region: Secondary | ICD-10-CM | POA: Diagnosis not present

## 2021-06-16 DIAGNOSIS — M5416 Radiculopathy, lumbar region: Secondary | ICD-10-CM | POA: Diagnosis not present

## 2021-06-16 DIAGNOSIS — M955 Acquired deformity of pelvis: Secondary | ICD-10-CM | POA: Diagnosis not present

## 2021-06-16 DIAGNOSIS — M62830 Muscle spasm of back: Secondary | ICD-10-CM | POA: Diagnosis not present

## 2021-06-21 DIAGNOSIS — M9903 Segmental and somatic dysfunction of lumbar region: Secondary | ICD-10-CM | POA: Diagnosis not present

## 2021-06-21 DIAGNOSIS — M9902 Segmental and somatic dysfunction of thoracic region: Secondary | ICD-10-CM | POA: Diagnosis not present

## 2021-06-21 DIAGNOSIS — M955 Acquired deformity of pelvis: Secondary | ICD-10-CM | POA: Diagnosis not present

## 2021-06-21 DIAGNOSIS — M62830 Muscle spasm of back: Secondary | ICD-10-CM | POA: Diagnosis not present

## 2021-06-21 DIAGNOSIS — M5416 Radiculopathy, lumbar region: Secondary | ICD-10-CM | POA: Diagnosis not present

## 2021-06-21 DIAGNOSIS — M9905 Segmental and somatic dysfunction of pelvic region: Secondary | ICD-10-CM | POA: Diagnosis not present

## 2021-06-27 DIAGNOSIS — M955 Acquired deformity of pelvis: Secondary | ICD-10-CM | POA: Diagnosis not present

## 2021-06-27 DIAGNOSIS — M9905 Segmental and somatic dysfunction of pelvic region: Secondary | ICD-10-CM | POA: Diagnosis not present

## 2021-06-27 DIAGNOSIS — M5416 Radiculopathy, lumbar region: Secondary | ICD-10-CM | POA: Diagnosis not present

## 2021-06-27 DIAGNOSIS — M9902 Segmental and somatic dysfunction of thoracic region: Secondary | ICD-10-CM | POA: Diagnosis not present

## 2021-06-27 DIAGNOSIS — M9903 Segmental and somatic dysfunction of lumbar region: Secondary | ICD-10-CM | POA: Diagnosis not present

## 2021-06-27 DIAGNOSIS — M62830 Muscle spasm of back: Secondary | ICD-10-CM | POA: Diagnosis not present

## 2021-06-30 DIAGNOSIS — M9905 Segmental and somatic dysfunction of pelvic region: Secondary | ICD-10-CM | POA: Diagnosis not present

## 2021-06-30 DIAGNOSIS — M62830 Muscle spasm of back: Secondary | ICD-10-CM | POA: Diagnosis not present

## 2021-06-30 DIAGNOSIS — M5416 Radiculopathy, lumbar region: Secondary | ICD-10-CM | POA: Diagnosis not present

## 2021-06-30 DIAGNOSIS — M9902 Segmental and somatic dysfunction of thoracic region: Secondary | ICD-10-CM | POA: Diagnosis not present

## 2021-06-30 DIAGNOSIS — M9903 Segmental and somatic dysfunction of lumbar region: Secondary | ICD-10-CM | POA: Diagnosis not present

## 2021-06-30 DIAGNOSIS — M955 Acquired deformity of pelvis: Secondary | ICD-10-CM | POA: Diagnosis not present

## 2021-07-04 DIAGNOSIS — M5416 Radiculopathy, lumbar region: Secondary | ICD-10-CM | POA: Diagnosis not present

## 2021-07-04 DIAGNOSIS — M9903 Segmental and somatic dysfunction of lumbar region: Secondary | ICD-10-CM | POA: Diagnosis not present

## 2021-07-04 DIAGNOSIS — M9905 Segmental and somatic dysfunction of pelvic region: Secondary | ICD-10-CM | POA: Diagnosis not present

## 2021-07-04 DIAGNOSIS — M62830 Muscle spasm of back: Secondary | ICD-10-CM | POA: Diagnosis not present

## 2021-07-04 DIAGNOSIS — M955 Acquired deformity of pelvis: Secondary | ICD-10-CM | POA: Diagnosis not present

## 2021-07-04 DIAGNOSIS — M9902 Segmental and somatic dysfunction of thoracic region: Secondary | ICD-10-CM | POA: Diagnosis not present

## 2021-07-07 DIAGNOSIS — M62830 Muscle spasm of back: Secondary | ICD-10-CM | POA: Diagnosis not present

## 2021-07-07 DIAGNOSIS — M9905 Segmental and somatic dysfunction of pelvic region: Secondary | ICD-10-CM | POA: Diagnosis not present

## 2021-07-07 DIAGNOSIS — M9903 Segmental and somatic dysfunction of lumbar region: Secondary | ICD-10-CM | POA: Diagnosis not present

## 2021-07-07 DIAGNOSIS — M5416 Radiculopathy, lumbar region: Secondary | ICD-10-CM | POA: Diagnosis not present

## 2021-07-07 DIAGNOSIS — M955 Acquired deformity of pelvis: Secondary | ICD-10-CM | POA: Diagnosis not present

## 2021-07-07 DIAGNOSIS — M9902 Segmental and somatic dysfunction of thoracic region: Secondary | ICD-10-CM | POA: Diagnosis not present

## 2021-07-12 DIAGNOSIS — M62830 Muscle spasm of back: Secondary | ICD-10-CM | POA: Diagnosis not present

## 2021-07-12 DIAGNOSIS — M5416 Radiculopathy, lumbar region: Secondary | ICD-10-CM | POA: Diagnosis not present

## 2021-07-12 DIAGNOSIS — M955 Acquired deformity of pelvis: Secondary | ICD-10-CM | POA: Diagnosis not present

## 2021-07-12 DIAGNOSIS — M9902 Segmental and somatic dysfunction of thoracic region: Secondary | ICD-10-CM | POA: Diagnosis not present

## 2021-07-12 DIAGNOSIS — M9905 Segmental and somatic dysfunction of pelvic region: Secondary | ICD-10-CM | POA: Diagnosis not present

## 2021-07-12 DIAGNOSIS — M9903 Segmental and somatic dysfunction of lumbar region: Secondary | ICD-10-CM | POA: Diagnosis not present

## 2021-11-27 ENCOUNTER — Emergency Department
Admission: EM | Admit: 2021-11-27 | Discharge: 2021-11-27 | Disposition: A | Discharge status: Home / Self Care | Payer: BC Managed Care – PPO | Attending: Emergency Medicine | Admitting: Emergency Medicine

## 2021-11-27 ENCOUNTER — Emergency Department: Payer: BC Managed Care – PPO

## 2021-11-27 ENCOUNTER — Other Ambulatory Visit: Payer: Self-pay

## 2021-11-27 DIAGNOSIS — S0101XA Laceration without foreign body of scalp, initial encounter: Secondary | ICD-10-CM

## 2021-11-27 DIAGNOSIS — E119 Type 2 diabetes mellitus without complications: Secondary | ICD-10-CM | POA: Insufficient documentation

## 2021-11-27 DIAGNOSIS — S0990XA Unspecified injury of head, initial encounter: Secondary | ICD-10-CM | POA: Diagnosis not present

## 2021-11-27 DIAGNOSIS — Z23 Encounter for immunization: Secondary | ICD-10-CM | POA: Insufficient documentation

## 2021-11-27 MED ORDER — TETANUS-DIPHTH-ACELL PERTUSSIS 5-2.5-18.5 LF-MCG/0.5 IM SUSY
0.5000 mL | PREFILLED_SYRINGE | Freq: Once | INTRAMUSCULAR | Status: AC
  Administered 2021-11-27: 0.5 mL via INTRAMUSCULAR
  Filled 2021-11-27: qty 0.5

## 2021-11-27 MED ORDER — LIDOCAINE-EPINEPHRINE 2 %-1:100000 IJ SOLN
20.0000 mL | Freq: Once | INTRAMUSCULAR | Status: AC
  Administered 2021-11-27: 20 mL
  Filled 2021-11-27: qty 1

## 2021-11-27 NOTE — ED Triage Notes (Signed)
Pt arrives with c/o head injury that happened today. Pt was hit in the head with a post with nails on it. Pt has a laceration right side of his head. Bleeding is controlled. Pt denies blood thinners or LOC.

## 2021-11-27 NOTE — ED Provider Notes (Signed)
[facility] Provider Note    Event Date/Time   First MD Initiated Contact with Patient 11/27/21 1454     (approximate)   History   Head Injury   HPI  [name] is a 46 y.o. male with past medical history of diabetes, straight splenectomy who presents with a head injury.  Patient was working outside with a was trying to move the pole and the ground when it hit him in the head.  There was a nail that was sticking out and hit him in the head.  He has a large laceration to the right side of his head.  This happened about 3 hours ago.  Denies significant headache visual change neck pain.  He is not on any blood thinners.  Unsure of when his last tetanus shot was.     Past Medical History:  Diagnosis Date   Allergy    Anemia    hereditary spherocytosis, resolved after splenectomy in childhood per patient   Diabetes mellitus without complication [facility])     Patient Active Problem List   Diagnosis Date Noted   Low back pain 11/03/2019   Encounter for general adult medical examination with abnormal findings 09/01/2018   Advance care planning 09/01/2018   Elevated blood pressure reading 02/07/2016   Anxiety 02/07/2016   OSA on CPAP 12/09/2015   Nocturia 12/09/2015   Pain in joint, ankle and foot 09/30/2014   History of diabetes mellitus 01/14/2012   AC joint pain 01/14/2012   Obesity 02/28/2011   Smoking 02/28/2011   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 --      Excl. in [place]? --     Most recent vital signs: Vitals:    11/27/21 1451  BP: (!) 168/51  Pulse: 83  Resp: 18  Temp: 98.3 F (36.8 C)  SpO2: 94%     General: Awake, no distress.  CV:  Good peripheral perfusion.  Resp:  Normal effort.  Abd:  No distention.  Neuro:             Awake, Alert, Oriented x 3  Other:  There is a large approximately 8 cm laceration on the right frontal scalp that is well approximated, hemostatic   ED Results / Procedures / Treatments  Labs (all labs ordered are listed, but only abnormal results are displayed) Labs Reviewed - No data to display   EKG     RADIOLOGY    PROCEDURES:  Critical Care performed: No  ..Laceration Repair  Date/Time: 11/27/2021 4:05 PM  Performed by: Rada Hay, MD Authorized by: Rada Hay, MD   Consent:    Consent obtained:  Verbal   Risks discussed:  Infection and pain Universal protocol:    Patient identity  confirmed:  Verbally with patient Laceration details:    Location:  Scalp   Scalp location:  R temporal   Length (cm):  8 Exploration:    Imaging outcome: foreign body not noted     Contaminated: no   Treatment:    Area cleansed with:  Saline   Amount of cleaning:  Standard   Irrigation solution:  Sterile saline   Irrigation method:  Syringe   Visualized foreign bodies/material removed: no     Debridement:  None   Undermining:  None Skin repair:    Repair method:  Tissue adhesive Approximation:    Approximation:  Close Repair type:    Repair type:  Simple Post-procedure details:    Dressing:  Open (no dressing)     MEDICATIONS ORDERED IN ED: Medications  lidocaine-EPINEPHrine (XYLOCAINE W/EPI) 2 %-1:100000 (with pres) injection 20 mL (has no administration in time range)  Tdap (BOOSTRIX) injection 0.5 mL (0.5 mLs Intramuscular Given 11/27/21 1515)     IMPRESSION / MDM / [place] / ED COURSE  I reviewed the triage vital signs and the nursing notes.                              Patient's presentation is most  consistent with acute presentation with potential threat to life or bodily function.  Differential diagnosis includes, but is not limited to, skull fracture, intracranial hemorrhage, laceration  The patient is a 46 year old male presents with a laceration to the right frontal scalp.  This occurred after a pole that he was working with hit him in the head and there was a nail sticking out of the pool.  He has a fairly large laceration is well approximated on the scalp.  Denies headache.  Will obtain CT head to rule out skull fracture or other more serious underlying injury.  Will update tetanus.  CT head does not show any underlying injury.  The wound was thoroughly irrigated and because it was quite well approximated there was closed with glue.  We discussed return precautions.  He is appropriate for discharge.       FINAL CLINICAL IMPRESSION(S) / ED DIAGNOSES   Final diagnoses:  Laceration of scalp, initial encounter     Rx / DC Orders   ED Discharge Orders     None        Note:  This document was prepared using Dragon voice recognition software and may include unintentional dictation errors.   Rada Hay, MD 11/27/21 [phone]

## 2022-05-12 ENCOUNTER — Ambulatory Visit: Payer: BC Managed Care – PPO | Admitting: Family Medicine

## 2022-05-12 ENCOUNTER — Encounter: Payer: Self-pay | Admitting: Family Medicine

## 2022-05-12 ENCOUNTER — Telehealth: Payer: Self-pay

## 2022-05-12 ENCOUNTER — Ambulatory Visit (INDEPENDENT_AMBULATORY_CARE_PROVIDER_SITE_OTHER)
Admission: RE | Admit: 2022-05-12 | Discharge: 2022-05-12 | Disposition: A | Discharge status: Home / Self Care | Payer: BC Managed Care – PPO | Source: Ambulatory Visit | Attending: Family Medicine | Admitting: Family Medicine

## 2022-05-12 VITALS — BP 138/80 | HR 83 | Temp 98.3°F | Ht 72.0 in | Wt 373.0 lb

## 2022-05-12 DIAGNOSIS — R202 Paresthesia of skin: Secondary | ICD-10-CM

## 2022-05-12 DIAGNOSIS — F419 Anxiety disorder, unspecified: Secondary | ICD-10-CM | POA: Diagnosis not present

## 2022-05-12 LAB — CBC WITH DIFFERENTIAL/PLATELET
Basophils Absolute: 0.2 10*3/uL — ABNORMAL HIGH (ref 0.0–0.1)
Basophils Relative: 0.9 % (ref 0.0–3.0)
Eosinophils Absolute: 0.2 10*3/uL (ref 0.0–0.7)
Eosinophils Relative: 1.2 % (ref 0.0–5.0)
HCT: 45 % (ref 39.0–52.0)
Hemoglobin: 15.7 g/dL (ref 13.0–17.0)
Lymphocytes Relative: 20.7 % (ref 12.0–46.0)
Lymphs Abs: 4.2 10*3/uL — ABNORMAL HIGH (ref 0.7–4.0)
MCHC: 34.8 g/dL (ref 30.0–36.0)
MCV: 84.3 fl (ref 78.0–100.0)
Monocytes Absolute: 2.3 10*3/uL — ABNORMAL HIGH (ref 0.1–1.0)
Monocytes Relative: 11.3 % (ref 3.0–12.0)
Neutro Abs: 13.5 10*3/uL — ABNORMAL HIGH (ref 1.4–7.7)
Neutrophils Relative %: 65.9 % (ref 43.0–77.0)
Platelets: 552 10*3/uL — ABNORMAL HIGH (ref 150.0–400.0)
RBC: 5.33 Mil/uL (ref 4.22–5.81)
RDW: 13.9 % (ref 11.5–15.5)
WBC: 20.6 10*3/uL (ref 4.0–10.5)

## 2022-05-12 LAB — TSH: TSH: 1.21 u[IU]/mL (ref 0.35–5.50)

## 2022-05-12 LAB — BASIC METABOLIC PANEL
BUN: 14 mg/dL (ref 6–23)
CO2: 29 mEq/L (ref 19–32)
Calcium: 10.4 mg/dL (ref 8.4–10.5)
Chloride: 98 mEq/L (ref 96–112)
Creatinine, Ser: 0.87 mg/dL (ref 0.40–1.50)
GFR: 102.98 mL/min (ref >60.00)
Glucose, Bld: 163 mg/dL — ABNORMAL HIGH (ref 70–99)
Potassium: 4.6 mEq/L (ref 3.5–5.1)
Sodium: 136 mEq/L (ref 135–145)

## 2022-05-12 MED ORDER — CLONAZEPAM 0.5 MG PO TABS: 0.2500 mg | ORAL_TABLET | Freq: Every day | ORAL | 1 refills | Status: DC | PRN

## 2022-05-12 NOTE — Telephone Encounter (Signed)
Lowry Ram with [facility] called critical lab result WBC high at 20.6 sending note to Dr Damita Dunnings, Damita Dunnings pool and will teams [place] CMA. Result noted in lab notebook also.

## 2022-05-12 NOTE — Patient Instructions (Addendum)
Go to the lab on the way out.   If you have mychart we'll likely use that to update you.    Take care.  Glad to see you. If you need to take klonopin more than weekly, then you likely need a different medicine.  Keep tapering caffeine without quitting cold [place].

## 2022-05-12 NOTE — Progress Notes (Unsigned)
Anxiety f/u.  Hydroxyzine is not helping much anymore.  Anxiety worse in the AM, before and when he gets to work.  Better in the afternoons but not resolved.  Divorced and living alone.  Discussed.  No SI/HI.  No illicits, not tobacco, rare etoh.  Sx have been worse over the last ~3 months.  He talked to his sister about options in the meantime.  His sister has been treated with klonopin.  He took one dose and felt better.  Still using CPAP.  Not snoring with that.  He has difficulty dealing with noisy, crowded areas at baseline.  No CP with exertion at work.  Occ L arm numbness w/o neck pain.  Resolved with repositioning.  No sx now.  L arm sx going on intermittently for years.  Cutting back on caffeine helped with transient heart rate elevation.  2L mountain dew daily.    Discussed checking basic labs. See orders.    Meds, vitals, and allergies reviewed.   ROS: Per HPI unless specifically indicated in ROS section   Normal S/S x4.  Normal neck ROM.

## 2022-05-12 NOTE — Telephone Encounter (Signed)
Similar to prev, please see result note.

## 2022-05-14 DIAGNOSIS — R202 Paresthesia of skin: Secondary | ICD-10-CM | POA: Insufficient documentation

## 2022-05-14 NOTE — Assessment & Plan Note (Signed)
Discussed options.  Advised to slowly taper caffeine.  Prescription written for Klonopin with routine benzodiazepine discussion.  Use as needed.  Sedation caution.  If needing Klonopin more than weekly then reasonable to start preventive medication, i.e. SSRI likely.  Okay for outpatient follow-up.  He agrees with plan.

## 2022-05-14 NOTE — Assessment & Plan Note (Signed)
Normal neck range of motion and normal strength and sensation in the extremities at time of office visit.  See notes on imaging.

## 2022-05-16 NOTE — Telephone Encounter (Signed)
Pt called for lab & xray results. Pt states he currently takes the meds, clonazePAM (KLONOPIN) 0.5 MG tablet, twice weekly. Call back # [phone]

## 2022-05-16 NOTE — Telephone Encounter (Signed)
Patient has been notified of results.  

## 2022-06-07 DIAGNOSIS — M1711 Unilateral primary osteoarthritis, right knee: Secondary | ICD-10-CM | POA: Diagnosis not present

## 2022-07-11 ENCOUNTER — Other Ambulatory Visit: Payer: Self-pay | Admitting: Family Medicine

## 2022-07-11 NOTE — Telephone Encounter (Signed)
Refill request for CLONAZEPAM 0.5 MG TABLET   LOV - 05/12/22 Next OV - 07/13/22 Last refill - 05/12/22 #10/1

## 2022-07-12 NOTE — Telephone Encounter (Signed)
Sent. Thanks.   

## 2022-07-13 ENCOUNTER — Encounter: Payer: Self-pay | Admitting: Family Medicine

## 2022-07-13 ENCOUNTER — Ambulatory Visit: Payer: BC Managed Care – PPO | Admitting: Family Medicine

## 2022-07-13 VITALS — BP 158/72 | HR 70 | Temp 97.3°F | Ht 72.0 in | Wt 367.0 lb

## 2022-07-13 DIAGNOSIS — I10 Essential (primary) hypertension: Secondary | ICD-10-CM

## 2022-07-13 DIAGNOSIS — F419 Anxiety disorder, unspecified: Secondary | ICD-10-CM | POA: Diagnosis not present

## 2022-07-13 DIAGNOSIS — R0789 Other chest pain: Secondary | ICD-10-CM

## 2022-07-13 DIAGNOSIS — G4733 Obstructive sleep apnea (adult) (pediatric): Secondary | ICD-10-CM

## 2022-07-13 MED ORDER — METOPROLOL SUCCINATE ER 25 MG PO TB24: 25.0000 mg | ORAL_TABLET | Freq: Every day | ORAL | 3 refills | Status: DC

## 2022-07-13 NOTE — Patient Instructions (Signed)
Add on metoprolol  a day.  Take it daily and let me know if you don't feel better/have better BP control.  You should get a call about seeing cardiology.  Take care.  Glad to see you.

## 2022-07-13 NOTE — Progress Notes (Signed)
Anxiety.  Some transient relief with klonopin.  No ADE on med.  Taking Klonopin as needed, not daily.  Routine benzodiazepine cautions discussed with patient.  Hypertension:    No meds.  Chest pain with exertion: no but he can have random onset of chest discomfort- variable location across the chest- not consistent.   Edema:no Short of breath: with panic sx.   Average home BPs: usually home checks 140-150/70-90, with some dec in SBP after taking klonopin.   He got SOB with doing chores at home.  Unclear if true exertional SOB or panic sx.    Still using CPAP at baseline.  Compliant.  He feels like he is sleeping well.    FH of elevated WBC, he had h/o similar.  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  D/w pt.    Meds, vitals, and allergies reviewed.   ROS: Per HPI unless specifically indicated in ROS section   GEN: nad, alert and oriented HEENT: ncat NECK: supple w/o LA CV: rrr. PULM: ctab, no inc wob ABD: soft, +bs EXT: no edema SKIN: no acute rash  EKG without acute changes.  Discussed with patient at office visit.

## 2022-07-16 DIAGNOSIS — R0789 Other chest pain: Secondary | ICD-10-CM | POA: Insufficient documentation

## 2022-07-16 NOTE — Assessment & Plan Note (Signed)
Atypical chest pain could be anxiety related.  Discussed options.  Adding on beta-blocker in the meantime.  Refer to cardiology.  He agrees to plan.  Okay for outpatient follow-up.

## 2022-07-16 NOTE — Assessment & Plan Note (Signed)
Add on metoprolol 25 mg a day.  He can update me if his blood pressure does not improve.

## 2022-07-16 NOTE — Assessment & Plan Note (Signed)
Continue with CPAP

## 2022-07-16 NOTE — Assessment & Plan Note (Signed)
Continue as needed clonazepam.  Add on metoprolol 25 mg daily to see if that helps with both his blood pressure and also anxiety.  Okay for outpatient follow-up.  Routine cautions given to patient.

## 2022-07-26 ENCOUNTER — Telehealth: Payer: Self-pay | Admitting: Family Medicine

## 2022-07-26 MED ORDER — METOPROLOL SUCCINATE ER 25 MG PO TB24: 50.0000 mg | ORAL_TABLET | Freq: Every day | ORAL | Status: DC

## 2022-07-26 NOTE — Telephone Encounter (Signed)
Called patient and advised to increase to 50 mg and see how that goes. Advised to call back if it does not help.

## 2022-07-26 NOTE — Telephone Encounter (Signed)
Spoke with patient and he said he has been having dizziness and no energy since starting the metoprolol. I asked patient about his BP and he said that Friday his BP was 161/62 when he went to the fire station after taking a clonazepam. He had to wait 30 to 45 minutes for the medic to get in and when they rechecked his BP it was 116/50. He wants to know if theres something else he can change to or what he needs to do?

## 2022-07-26 NOTE — Telephone Encounter (Signed)
Patient was dizzy when BP was 161/62. He felt better once BP was normal. Patient stated that when he takes the clonazepam his BP lowers.

## 2022-07-26 NOTE — Telephone Encounter (Signed)
Did he feel dizzy when his BP was higher at 161/62 or when his BP was lower at 116/50?  Please let me know.  Thanks.

## 2022-07-26 NOTE — Telephone Encounter (Signed)
Pt called in requesting a call back would like to discuss his medication  #[phone]

## 2022-07-26 NOTE — Telephone Encounter (Signed)
I would try increasing metoprolol to 50 mg a day to see if he can keep from having blood pressure elevations in the first place.  Please let me know how that goes.

## 2022-08-15 ENCOUNTER — Other Ambulatory Visit: Payer: Self-pay | Admitting: Family Medicine

## 2022-08-16 NOTE — Telephone Encounter (Signed)
Refill request for CLONAZEPAM 0.5 MG TABLET   LOV - 07/13/22 Next OV - not scheduled Last refill - 07/12/22 #10/1

## 2022-08-16 NOTE — Telephone Encounter (Signed)
Sent. Thanks.   

## 2022-08-31 ENCOUNTER — Telehealth: Payer: Self-pay | Admitting: Family Medicine

## 2022-08-31 NOTE — Telephone Encounter (Signed)
Last phone note 5/8 was told to increase Metoprolol to 50 mg. Called patient to see  how he is doing on that strength and if he has any questions.  Mail box was full not able to leave message.

## 2022-08-31 NOTE — Telephone Encounter (Signed)
Prescription Request  08/31/2022  LOV: 07/13/2022  What is the name of the medication or equipment? metoprolol succinate (TOPROL-XL) 25 MG 24 hr tablet   Have you contacted your pharmacy to request a refill? No   Which pharmacy would you like this sent to?  [facility] [address], [place] - [address] [place] [address] Phone: [phone] Fax: [phone]    Patient notified that their request is being sent to the clinical staff for review and that they should receive a response within 2 business days.   Please advise at Mobile [phone] (mobile);l

## 2022-09-04 MED ORDER — METOPROLOL SUCCINATE ER 50 MG PO TB24: 50.0000 mg | ORAL_TABLET | Freq: Every day | ORAL | 2 refills | Status: DC

## 2022-09-04 NOTE — Telephone Encounter (Signed)
6.15.24  11:37pm  ---Caller states he is calling for metoprolol refill. Takes daily, last dose this morning and is out of rx. Denies stx. Pharmacy is not currently open.

## 2022-09-04 NOTE — Telephone Encounter (Signed)
Patient stated it was increased to 50mg .  Called [facility] over the weekend:   ---Caller states he is calling for metoprolol refill. Takes daily, last dose this morning and is out of rx. Denies stx. Pharmacy is not currently open.  ---Pt states he is prescribed Metoprolol Succinate ER 25 mg prescribed by Dr Para March. Pt has been waiting since Friday for the prescriptions. Pt states provider told him to take 2 tabs instead of one tab and because of this [facility] [address] [place], [place], [place] [phone] cannot give loaner dose. Last dose yesterday morning ( 50 mg ) . Pt is completely out. Currently, no new or worsening symptoms .  Has enough for tomorrow, but after that no more left

## 2022-09-04 NOTE — Telephone Encounter (Signed)
Per Dr. Para March ok to send new prescription for metoprolol succinate 50mg .   Called pt and made him aware that the new prescription was being sent.

## 2022-09-04 NOTE — Addendum Note (Signed)
Addended by: Benedict Needy on: 09/04/2022 09:28 AM   Modules accepted: Orders

## 2022-09-04 NOTE — Telephone Encounter (Signed)
Patient stated it was increased to 50mg .  Called [facility] over the weekend:  ---Pt states he is prescribed Metoprolol Succinate ER 25 mg prescribed by Dr Para March. Pt has been waiting since Friday for the prescriptions. Pt states provider told him to take 2 tabs instead of one tab and because of this [facility] [address] [place], [place], [place] [phone] cannot give loaner dose. Last dose yesterday morning ( 50 mg ) . Pt is completely out. Currently, no new or worsening symptoms .  Has enough for tomorrow, but after that no more left

## 2022-09-07 ENCOUNTER — Telehealth: Payer: Self-pay

## 2022-09-07 NOTE — Telephone Encounter (Signed)
Attempted to call the patient to inquire about previous cardiac history. Unable to LVM due to voicemail box being full. Patient is scheduled to see Dr. Azucena Cecil as a new patient on 09/15/22.

## 2022-09-08 DIAGNOSIS — M1711 Unilateral primary osteoarthritis, right knee: Secondary | ICD-10-CM | POA: Diagnosis not present

## 2022-09-15 ENCOUNTER — Ambulatory Visit: Payer: BC Managed Care – PPO | Attending: Cardiology | Admitting: Cardiology

## 2022-09-15 ENCOUNTER — Encounter: Payer: Self-pay | Admitting: Cardiology

## 2022-09-15 VITALS — BP 124/68 | HR 68 | Ht 72.0 in | Wt 368.2 lb

## 2022-09-15 DIAGNOSIS — R072 Precordial pain: Secondary | ICD-10-CM

## 2022-09-15 DIAGNOSIS — I10 Essential (primary) hypertension: Secondary | ICD-10-CM

## 2022-09-15 NOTE — Progress Notes (Signed)
Cardiology Office Note:    Date:  09/15/2022   ID:  [name], DOB Sep 15, 1975, MRN [id number]  PCP:  Joaquim Nam, MD   [facility] Providers Cardiologist:  Debbe Odea, MD     Referring MD: Joaquim Nam, MD   Chief Complaint  Patient presents with   New Patient (Initial Visit)    Referred for evaluating of Atypical chest pain.  No cardiac history.   [name] is a 47 y.o. male who is being seen today for the evaluation of chest pain at the request of Joaquim Nam, MD.  History of Present Illness:    [name] is a 47 y.o. male with a hx of anxiety, hypertension, former smoker x 17 years, obesity presenting with chest pain.  States having occasional chest pain over the past 3 months.  Symptoms are not related with exertion.  Describes pain as slightly sharp, located on the left upper chest and right upper chest.  He also complains of shoulder joint pain which he attributes to lifting weights.  States likely having arthritis in his back and knees due to significant pain.  Started on metoprolol for BP control, states having some fatigue.  Denies any family history of heart disease or heart attacks.  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 Take with or immediately following a meal.     Allergies:   Aspirin   Social History   Socioeconomic History   Marital status:  Divorced    Spouse name: Not on file   Number of children: Not on file   Years of education: Not on file   Highest education level: Not on file  Occupational History   Not on file  Tobacco Use   Smoking status: Former    Packs/day: 1.00    Years: 17.00    Additional pack years: 0.00    Total pack years: 17.00    Types: Cigarettes    Quit date: 2014    Years since quitting: 10.4   Smokeless tobacco: Current    Types: Snuff   Tobacco comments:    since 02/2013  Vaping Use   Vaping Use: Never used  Substance and Sexual Activity   Alcohol use: No    Alcohol/week: 0.0 standard drinks of alcohol   Drug use: No   Sexual activity: Yes  Other Topics Concern   Not on file  Social History Narrative   Divorced 2022, was married 2009, has 2 step kids   Plumbing and pressure washing business.     Social Determinants of Health   Financial Resource Strain: Not on file  Food Insecurity: Not on file  Transportation Needs: Not on file  Physical Activity: Not on file  Stress: Not on file  Social Connections: Not on file     Family History: The  patient's family history includes Anemia in his maternal grandmother; Arthritis in his father and mother; Diabetes in his father and mother; Heart disease in his mother; Hyperlipidemia in his mother; Hypertension in his mother. There is no history of Colon cancer or Prostate cancer.  ROS:   Please see the history of present illness.     All other systems reviewed and are negative.  EKGs/Labs/Other Studies Reviewed:    The following studies were reviewed today:  EKG Interpretation Date/Time:  Friday September 15 2022 09:32:36 EDT Ventricular Rate:  68 PR Interval:  170 QRS Duration:  96 QT Interval:  414 QTC Calculation: 440 R Axis:   0  Text Interpretation: Normal sinus rhythm Minimal voltage criteria for LVH, may be normal variant ( R in aVL ) Confirmed by Debbe Odea ([id number]) on 09/15/2022 9:40:13 AM    Recent Labs: 05/12/2022:  BUN 14; Creatinine, Ser 0.87; Hemoglobin 15.7; Platelets 552.0; Potassium 4.6; Sodium 136; TSH 1.21  Recent Lipid Panel    Component Value Date/Time   CHOL 159 08/26/2018 0000   CHOL 180 10/23/2014 0000   TRIG 380 (A) 08/26/2018 0000   HDL 21.40 (L) 08/23/2018 0923   CHOLHDL 8 08/23/2018 0923   VLDL 62.8 (H) 08/23/2018 0923   LDLCALC 60 08/26/2018 0000   LDLDIRECT 114.0 08/23/2018 0923     Risk Assessment/Calculations:             Physical Exam:    VS:  BP 124/68 (BP Location: Left Arm, Patient Position: Sitting, Cuff Size: Large)   Pulse 68   Ht 6' (1.829 m)   Wt (!) 368 lb 3.2 oz (167 kg)   SpO2 96%   BMI 49.94 kg/m     Wt Readings from Last 3 Encounters:  09/15/22 (!) 368 lb 3.2 oz (167 kg)  07/13/22 (!) 367 lb (166.5 kg)  05/12/22 (!) 373 lb (169.2 kg)     GEN:  Well nourished, well developed in no acute distress HEENT: Normal NECK: No JVD; No carotid bruits CARDIAC: RRR, no murmurs, rubs, gallops RESPIRATORY:  Clear to auscultation without rales, wheezing or rhonchi  ABDOMEN: Soft, non-tender, non-distended MUSCULOSKELETAL:  No edema; left shoulder tenderness on raising arm above shoulder level. SKIN: Warm and dry NEUROLOGIC:  Alert and oriented x 3 PSYCHIATRIC:  Normal affect   ASSESSMENT:    1. Precordial pain   2. Morbid obesity (HCC)   3. Primary hypertension    PLAN:    In order of problems listed above:  Chest pain, appears noncardiac, likely musculoskeletal.  Get echo to rule out any structural abnormalities.  Follow-up with PCP and or Ortho regarding possible arthritis in shoulders.  With morbid obesity and claustrophobia, noninvasive ischemic testing will be suboptimal. Morbid obesity, low-calorie diet, weight loss advised. Hypertension, BP controlled on metoprolol.  Follow-up in 2 months after echo       Medication Adjustments/Labs and Tests Ordered: Current medicines are reviewed at length with the patient today.  Concerns regarding  medicines are outlined above.  Orders Placed This Encounter  Procedures   EKG 12-Lead   ECHOCARDIOGRAM COMPLETE   No orders of the defined types were placed in this encounter.   Patient Instructions  Medication Instructions:  Your physician recommends that you continue on your current medications as directed. Please refer to the Current Medication list given to you today.  *If you need a refill on your cardiac medications before your next appointment, please call your pharmacy*  Lab Work: -None ordered  Testing/Procedures: Your physician has requested that you have an echocardiogram. Echocardiography is a painless test that uses sound waves to create images of your heart. It provides your doctor with information about the size and shape of your heart and how well your heart's chambers and valves are working. This procedure takes approximately one hour. There are no restrictions for this procedure. Please do NOT wear cologne, perfume, aftershave, or lotions (deodorant is allowed). Please arrive 15 minutes prior to your appointment time.   Follow-Up: At [facility], you and your health needs are our priority.  As part of our continuing mission to provide you with exceptional heart care, we have created designated Provider Care Teams.  These Care Teams include your primary Cardiologist (physician) and Advanced Practice Providers (APPs -  Physician Assistants and Nurse Practitioners) who all work together to provide you with the care you need, when you need it.  Your next appointment:   2 month(s)  Provider:   You may see Debbe Odea, MD or one of the following Advanced Practice Providers on your designated Care Team:   Nicolasa Ducking, NP Eula Listen, PA-C Cadence Fransico Michael, PA-C Charlsie Quest, NP    Other Instructions -None    Signed, Debbe Odea, MD  09/15/2022 10:56 AM    [facility]

## 2022-09-15 NOTE — Patient Instructions (Signed)
Medication Instructions:  Your physician recommends that you continue on your current medications as directed. Please refer to the Current Medication list given to you today.  *If you need a refill on your cardiac medications before your next appointment, please call your pharmacy*  Lab Work: -None ordered  Testing/Procedures: Your physician has requested that you have an echocardiogram. Echocardiography is a painless test that uses sound waves to create images of your heart. It provides your doctor with information about the size and shape of your heart and how well your heart's chambers and valves are working. This procedure takes approximately one hour. There are no restrictions for this procedure. Please do NOT wear cologne, perfume, aftershave, or lotions (deodorant is allowed). Please arrive 15 minutes prior to your appointment time.   Follow-Up: At [facility], you and your health needs are our priority.  As part of our continuing mission to provide you with exceptional heart care, we have created designated Provider Care Teams.  These Care Teams include your primary Cardiologist (physician) and Advanced Practice Providers (APPs -  Physician Assistants and Nurse Practitioners) who all work together to provide you with the care you need, when you need it.  Your next appointment:   2 month(s)  Provider:   You may see Debbe Odea, MD or one of the following Advanced Practice Providers on your designated Care Team:   Nicolasa Ducking, NP Eula Listen, PA-C Cadence Fransico Michael, PA-C Charlsie Quest, NP    Other Instructions -None

## 2022-09-25 ENCOUNTER — Other Ambulatory Visit: Payer: Self-pay | Admitting: Family Medicine

## 2022-09-25 NOTE — Telephone Encounter (Signed)
Sent. Thanks.   

## 2022-09-25 NOTE — Telephone Encounter (Signed)
Medication: Clonazepam 0.5 mg  Directions: Take 1-2 tablets po daily PRN for anxiety Last given: 08/16/22 Number refills: 1 Last o/v: 07/13/22 Follow up: Not on file  Labs: 05/12/22

## 2022-09-28 ENCOUNTER — Ambulatory Visit: Payer: BC Managed Care – PPO | Attending: Cardiology

## 2022-09-28 DIAGNOSIS — R072 Precordial pain: Secondary | ICD-10-CM | POA: Diagnosis not present

## 2022-09-28 MED ORDER — PERFLUTREN LIPID MICROSPHERE
1.0000 mL | INTRAVENOUS | Status: AC | PRN
Start: 2022-09-28 — End: 2022-09-28
  Administered 2022-09-28: 2 mL via INTRAVENOUS

## 2022-09-29 LAB — ECHOCARDIOGRAM COMPLETE: Area-P 1/2: 4.06 cm2

## 2022-11-05 ENCOUNTER — Other Ambulatory Visit: Payer: Self-pay | Admitting: Family Medicine

## 2022-11-06 NOTE — Telephone Encounter (Signed)
Refill request for CLONAZEPAM 0.5 MG TABLET   LOV - 07/13/22 Next OV - not scheduled Last refill - 09/25/22 #10/1

## 2022-11-16 ENCOUNTER — Ambulatory Visit: Payer: BC Managed Care – PPO | Admitting: Nurse Practitioner

## 2022-11-24 ENCOUNTER — Ambulatory Visit: Payer: BC Managed Care – PPO | Admitting: Family Medicine

## 2022-11-24 VITALS — BP 114/70 | HR 64 | Temp 97.4°F | Ht 72.0 in | Wt 360.0 lb

## 2022-11-24 DIAGNOSIS — I10 Essential (primary) hypertension: Secondary | ICD-10-CM

## 2022-11-24 DIAGNOSIS — R42 Dizziness and giddiness: Secondary | ICD-10-CM

## 2022-11-24 DIAGNOSIS — H6990 Unspecified Eustachian tube disorder, unspecified ear: Secondary | ICD-10-CM

## 2022-11-24 MED ORDER — FLUTICASONE PROPIONATE 50 MCG/ACT NA SUSP: 2.0000 | Freq: Every day | NASAL | Status: DC

## 2022-11-24 MED ORDER — METOPROLOL SUCCINATE ER 50 MG PO TB24: 25.0000 mg | ORAL_TABLET | Freq: Two times a day (BID) | ORAL | Status: DC

## 2022-11-24 NOTE — Patient Instructions (Addendum)
If you have vertigo, then use the bedside exercise.    Use flonase in the meantime.  Gently try to pop you ears.    You could try taking 1/2 tab of metoprolol twice a day and see if that is easier to tolerate.  Take care.  Glad to see you.

## 2022-11-24 NOTE — Progress Notes (Unsigned)
Taking klonpin prn, not daily.  Taking metoprolol daily.   "Dizzy spells."   Episodic.  Initially with mild sx.  Then at [facility] about 1 week ago, walking around the end of an aisle, turning to the right.  Didn't feel right, then escalated and room was spinning.  Didn't fall.  Lasted a few seconds then it resolved.  Not presyncopal on standing.   Can predate metoprolol start. No happening with rolling over in the bed. Occ sensation of ear congestion.   He is going to check with the eye clinic about his glasses.  No vision loss.  Due for glasses recheck.    No hearing changes.    He is fatigued after taking metoprolol at night.  He changed dose timing to PM instead of AM.    Post nasal gtt.

## 2022-11-26 DIAGNOSIS — H6990 Unspecified Eustachian tube disorder, unspecified ear: Secondary | ICD-10-CM | POA: Insufficient documentation

## 2022-11-26 DIAGNOSIS — R42 Dizziness and giddiness: Secondary | ICD-10-CM | POA: Insufficient documentation

## 2022-11-26 NOTE — Assessment & Plan Note (Signed)
It looks like he has 3 separate issues with vertigo, eustachian tube dysfunction, and potentially with fatigue related to metoprolol use.  He can try bedside exercise in case he has return of vertigo symptoms.  He could have had BPV.  Anatomy and pathophysiology discussed with patient.

## 2022-11-26 NOTE — Assessment & Plan Note (Signed)
It looks like he has 3 separate issues with vertigo, eustachian tube dysfunction, and potentially with fatigue related to metoprolol use.  Can try Flonase and gently perform Valsalva at home and see if his symptoms resolve.

## 2022-11-26 NOTE — Assessment & Plan Note (Signed)
It looks like he has 3 separate issues with vertigo, eustachian tube dysfunction, and potentially with fatigue related to metoprolol use.  Not lightheaded on standing at the office visit.  He can try taking metoprolol 25 mg twice a day and see if that is better tolerated.

## 2022-12-07 ENCOUNTER — Other Ambulatory Visit: Payer: Self-pay | Admitting: Family Medicine

## 2022-12-08 NOTE — Telephone Encounter (Signed)
Refill request for clonazePAM (KLONOPIN) 0.5 MG tablet   LOV - 11/24/22 Next OV - not scheduled Last refill - 11/07/22 #10/1

## 2023-01-03 ENCOUNTER — Other Ambulatory Visit: Payer: Self-pay | Admitting: Family Medicine

## 2023-01-03 NOTE — Telephone Encounter (Signed)
Prescription Request  01/03/2023  LOV: 11/24/2022  What is the name of the medication or equipment? clonazePAM (KLONOPIN) 0.5 MG tablet, he was wanting to get an increase of how much he gets at one time, he stated that he is having to refill it a lot more.   Have you contacted your pharmacy to request a refill? No   Which pharmacy would you like this sent to?  [facility] [address] [place] - [address] [place] [address] Phone: [phone] Fax: [phone]    Patient notified that their request is being sent to the clinical staff for review and that they should receive a response within 2 business days.   Please advise at [facility] [phone]

## 2023-01-04 MED ORDER — CLONAZEPAM 0.5 MG PO TABS: 0.2500 mg | ORAL_TABLET | Freq: Two times a day (BID) | ORAL | 1 refills | Status: DC | PRN

## 2023-01-04 NOTE — Telephone Encounter (Signed)
LAST APPOINTMENT DATE: 11/24/22   NEXT APPOINTMENT DATE: Visit date not found    LAST REFILL:12/09/22  QTY: #10 no rf

## 2023-01-04 NOTE — Telephone Encounter (Signed)
Sent rx. Please schedule OV so we can talk out med options so he wouldn't need klonopin as frequently.  Thanks.

## 2023-01-05 NOTE — Telephone Encounter (Signed)
Patient has been scheduled

## 2023-01-09 ENCOUNTER — Encounter: Payer: Self-pay | Admitting: Family Medicine

## 2023-01-09 ENCOUNTER — Ambulatory Visit: Payer: BC Managed Care – PPO | Admitting: Family Medicine

## 2023-01-09 VITALS — BP 134/68 | HR 70 | Temp 98.7°F | Ht 73.0 in | Wt 371.0 lb

## 2023-01-09 DIAGNOSIS — F419 Anxiety disorder, unspecified: Secondary | ICD-10-CM

## 2023-01-09 MED ORDER — VENLAFAXINE HCL ER 75 MG PO CP24: 75.0000 mg | ORAL_CAPSULE | Freq: Every day | ORAL | 3 refills | Status: DC

## 2023-01-09 MED ORDER — METOPROLOL SUCCINATE ER 25 MG PO TB24: 25.0000 mg | ORAL_TABLET | Freq: Two times a day (BID) | ORAL | Status: DC

## 2023-01-09 NOTE — Progress Notes (Unsigned)
Taking klonopin 1/2 tab per dose, not scheduled.  Rarely with 1 tab per dose.  Some fatigue, unclear if from metoprolol.    Prev vertigo sx are better, less common now and brief.    Valsalva helps with ETD sx.

## 2023-01-09 NOTE — Patient Instructions (Signed)
I would try venlafaxine 75mg  a day.  Then use klonopin if needed.  Update me in about 2 weeks- phone call- sooner if needed.   We may need to increase to 150mg  at that point.   Take care.  Glad to see you.

## 2023-01-10 NOTE — Assessment & Plan Note (Signed)
I would try venlafaxine 75mg  a day.  Prescription sent. Then use klonopin if needed.  Routine cautions given to patient. I asked him to update me in about 2 weeks- phone call- sooner if needed.  We may need to increase to 150mg  of venlafaxine at that point.  Okay for outpatient follow-up.

## 2023-02-05 ENCOUNTER — Other Ambulatory Visit: Payer: Self-pay | Admitting: Family Medicine

## 2023-02-05 NOTE — Telephone Encounter (Signed)
Patient called back stating that the pharmacy said that he doesn't have anymore refills left.

## 2023-02-05 NOTE — Telephone Encounter (Signed)
Called patient let know last refill should  still be at pharmacy. He will give them a call and let us know if any issues.

## 2023-02-05 NOTE — Telephone Encounter (Signed)
Last appointment 01/09/23 Next appointment no f/u  refill 01/04/23 #10 1rf  UDS n/a letter n/a

## 2023-02-06 ENCOUNTER — Other Ambulatory Visit: Payer: Self-pay | Admitting: Family Medicine

## 2023-02-06 NOTE — Telephone Encounter (Signed)
Left voicemail for patient to return call to office. 

## 2023-02-06 NOTE — Telephone Encounter (Signed)
Last office visit: 01/09/23 Next office visit: nothing scheduled Last refill: VENLAFAXINE HCL ER 75 MG CAP 01/09/23 The pharmacy is asking that the rx be changed to a 90 day supply with 2 refills

## 2023-02-06 NOTE — Telephone Encounter (Signed)
Sent. Thanks.  How does he feel after starting venlafaxine?  Please let me know.

## 2023-02-07 NOTE — Telephone Encounter (Signed)
Called patient unable to leave a vm, mailbox is full.

## 2023-02-07 NOTE — Telephone Encounter (Signed)
Awaiting call back from patient.  

## 2023-02-07 NOTE — Telephone Encounter (Signed)
How did patient feel with starting venlafaxine?  I sent 90 day supply but we may need to change the dose.  Thanks.

## 2023-02-14 NOTE — Telephone Encounter (Signed)
Spoke with pt, he reports that he is "doing good."  No other needs made known.

## 2023-04-18 ENCOUNTER — Telehealth: Payer: Self-pay | Admitting: Family Medicine

## 2023-04-18 NOTE — Telephone Encounter (Signed)
Done. Thanks. Please make sure it printed and send to patient.

## 2023-04-18 NOTE — Telephone Encounter (Signed)
Copied from CRM [id number]. Topic: General - Other >> Apr 18, 2023 11:54 AM Alona Bene A wrote: Reason for CRM: Patient called in to see if Dr. Para March could write a doctors note for Jury Duty for him in regards to his anxiety. Patient stated the jury duty is in March. Please contact patient to let him know if this is something that could be done.

## 2023-04-18 NOTE — Telephone Encounter (Signed)
Patient notified and letter has been mailed

## 2023-04-30 DIAGNOSIS — M25562 Pain in left knee: Secondary | ICD-10-CM | POA: Diagnosis not present

## 2023-05-10 ENCOUNTER — Other Ambulatory Visit: Payer: Self-pay | Admitting: Family Medicine

## 2023-05-10 NOTE — Telephone Encounter (Signed)
Copied from CRM [id number]. Topic: Clinical - Medication Refill >> May 10, 2023  1:15 PM Irine Seal wrote: Most Recent Primary Care Visit:  Provider: Joaquim Nam  Department: [facility]  Visit Type: OFFICE VISIT  Date: 01/09/2023  Medication: venlafaxine XR (EFFEXOR-XR) 75 MG 24 hr capsule  Has the patient contacted their pharmacy? No (Agent: If no, request that the patient contact the pharmacy for the refill. If patient does not wish to contact the pharmacy document the reason why and proceed with request.) (Agent: If yes, when and what did the pharmacy advise?)  Is this the correct pharmacy for this prescription? Yes If no, delete pharmacy and type the correct one.  This is the patient's preferred pharmacy:  [facility] [address] [place] - [address] [place] [address] Phone: [phone] Fax: [phone]   Has the prescription been filled recently? No  Is the patient out of the medication? No, almost   Has the patient been seen for an appointment in the last year OR does the patient have an upcoming appointment? Yes  Can we respond through MyChart? Yes  Agent: Please be advised that Rx refills may take up to 3 business days. We ask that you follow-up with your pharmacy.

## 2023-05-10 NOTE — Telephone Encounter (Signed)
Last appointment 01/09/23 Next appointment no f/u  refill 02/06/23 #10 1rf  UDS n/a letter n/a

## 2023-05-10 NOTE — Telephone Encounter (Signed)
Last refill: venlafaxine XR (EFFEXOR-XR) 75 MG 24 hr capsule 02/07/23 90 capsule 0 refill Last office visit: 01/09/23 Next office visit: nothing scheduled

## 2023-05-11 NOTE — Telephone Encounter (Signed)
 Sent. Thanks.

## 2023-06-14 ENCOUNTER — Telehealth: Payer: Self-pay

## 2023-06-14 ENCOUNTER — Encounter: Payer: Self-pay | Admitting: Family Medicine

## 2023-06-14 NOTE — Telephone Encounter (Signed)
 Noted. Thanks.

## 2023-06-14 NOTE — Telephone Encounter (Signed)
 Copied from CRM [id number]. Topic: Clinical - Medication Question >> Jun 14, 2023 11:11 AM Alcus Dad wrote: Reason for CRM: Patient is taking metoprolol succinate (TOPROL-XL) 25 MG 24 hr tablet for BP and feels tired, sluggish and sleepy. Patient is needing to speak with Dr. Para March to see if what he needs to do

## 2023-06-14 NOTE — Telephone Encounter (Signed)
 I spoke with pt; pt said for about 1 wk pt has been feeling tired and sleepy.pt said his routine has not changed except pt might be staying up little later than usual but pt does not think that should make him feel tired and this sleepy.pt said 2 days ago pt was lightheaded for few seconds only. No CP, SOB or H/A. Pt does not have way to ck BP. Offered pt appt for 06/15/23 but pt only wants to see Dr Para March and pt scheduled appt with Dr Para March on 06/18/23 at 10:30 with UC & ED precautions and pt voiced understanding. Sending note to Dr Para March and Para March pool.

## 2023-06-18 ENCOUNTER — Encounter: Payer: Self-pay | Admitting: Family Medicine

## 2023-06-18 ENCOUNTER — Ambulatory Visit: Admitting: Family Medicine

## 2023-06-18 VITALS — BP 138/80 | HR 70 | Temp 98.5°F | Ht 73.0 in | Wt 353.6 lb

## 2023-06-18 DIAGNOSIS — N529 Male erectile dysfunction, unspecified: Secondary | ICD-10-CM | POA: Diagnosis not present

## 2023-06-18 DIAGNOSIS — F419 Anxiety disorder, unspecified: Secondary | ICD-10-CM

## 2023-06-18 DIAGNOSIS — I10 Essential (primary) hypertension: Secondary | ICD-10-CM | POA: Diagnosis not present

## 2023-06-18 MED ORDER — FLUTICASONE PROPIONATE 50 MCG/ACT NA SUSP: 1.0000 | Freq: Every day | NASAL | Status: DC | PRN

## 2023-06-18 MED ORDER — SILDENAFIL CITRATE 20 MG PO TABS
20.0000 mg | ORAL_TABLET | Freq: Every day | ORAL | 2 refills | Status: DC | PRN
Start: 2023-06-18 — End: 2023-07-03

## 2023-06-18 MED ORDER — METOPROLOL SUCCINATE ER 50 MG PO TB24: 25.0000 mg | ORAL_TABLET | Freq: Every day | ORAL | Status: DC

## 2023-06-18 NOTE — Progress Notes (Unsigned)
 He had improved nasal sx initially with flonase but then had irritation.  Discussed using it less frequently or at a lower dose.  Intentional weight loss with intermittent fasting.  Not having sx of low sugars.  His knees feel better with his weight lower.  Anxiety d/w pt.  On venlafaxine.  Rare to no use of clonazepam recently.  No SI/HI.  Anxiety is better.  Not drowsy but "mellow" and he feels better about that.  Rarely lightheaded on standing.  No syncope. Sleeping well.  "I'm not as antsy and as worried about stuff as I was."   He doesn't have absence of worry but it is more manageable.  He is dating, in a relationship recently.    No FCNAVD.  ED noted. D/w pt.     Work is going well.   Meds, vitals, and allergies reviewed.   ROS: Per HPI unless specifically indicated in ROS section   GEN: nad, alert and oriented HEENT: mucous membranes moist NECK: supple w/o LA CV: rrr.  no murmur PULM: ctab, no inc wob ABD: soft, +bs EXT: no edema SKIN: Well-perfused. Speech and judgment normal.

## 2023-06-18 NOTE — Patient Instructions (Signed)
 Try cutting the metoprolol back to 1/2 tab a day.  Update me as needed If needed, could try sildenafil 20-100mg  a day.  I would start with 1 tab a day if needed, after cutting back on metoprolol.  Take care.  Glad to see you.

## 2023-06-20 DIAGNOSIS — N529 Male erectile dysfunction, unspecified: Secondary | ICD-10-CM | POA: Insufficient documentation

## 2023-06-20 NOTE — Assessment & Plan Note (Addendum)
 Improved with venlafaxine with decreased use of clonazepam.  No suicidal homicidal intent and okay for outpatient follow-up.  I would continue as is.  He can update me as needed.

## 2023-06-20 NOTE — Assessment & Plan Note (Addendum)
 If needed, could try sildenafil 20-100mg  a day.  I would start with 1 tab a day if needed, after cutting back on metoprolol.  Routine cautions given to patient.

## 2023-06-20 NOTE — Assessment & Plan Note (Signed)
 Unclear if treatment contributes to erectile dysfunction. Discussed.  He can try cutting the metoprolol back to 1/2 tab a day.  Update me as needed

## 2023-06-27 ENCOUNTER — Other Ambulatory Visit: Payer: Self-pay | Admitting: Family Medicine

## 2023-07-03 ENCOUNTER — Other Ambulatory Visit: Payer: Self-pay

## 2023-07-03 NOTE — Telephone Encounter (Signed)
 Copied from CRM [id number]. Topic: Clinical - Medication Question >> Jul 03, 2023 10:47 AM Albertha Alosa wrote: Reason for CRM: Patient called in would like fro Dr.Duncan to increase quantity of  sildenafil (REVATIO) 20 MG tablet  [id number]

## 2023-07-08 MED ORDER — SILDENAFIL CITRATE 20 MG PO TABS
20.0000 mg | ORAL_TABLET | Freq: Every day | ORAL | 5 refills | Status: DC | PRN
Start: 2023-07-08 — End: 2024-01-07

## 2023-07-08 NOTE — Telephone Encounter (Signed)
 Sent. Thanks.

## 2023-07-20 ENCOUNTER — Other Ambulatory Visit: Payer: Self-pay | Admitting: Family Medicine

## 2023-07-20 ENCOUNTER — Telehealth: Payer: Self-pay | Admitting: Family Medicine

## 2023-07-20 NOTE — Telephone Encounter (Signed)
 Returned call to patient. He should have rx available at pharmacy. Patient will reach out to pharmacy

## 2023-07-20 NOTE — Telephone Encounter (Signed)
 Copied from CRM [id number]. Topic: General - Other >> Jul 20, 2023 11:44 AM Jenice Mitts wrote: Reason for CRM: Patient is calling because he is out of his sildenafil  and would like to see if it can be processed quickly and sent today

## 2023-07-20 NOTE — Telephone Encounter (Signed)
 I did confirm with pharmacy that the rx is available and ready for pickup at the pharmacy

## 2023-08-26 DIAGNOSIS — M25562 Pain in left knee: Secondary | ICD-10-CM | POA: Diagnosis not present

## 2023-09-05 DIAGNOSIS — M25562 Pain in left knee: Secondary | ICD-10-CM | POA: Diagnosis not present

## 2023-09-10 DIAGNOSIS — S83232A Complex tear of medial meniscus, current injury, left knee, initial encounter: Secondary | ICD-10-CM | POA: Diagnosis not present

## 2023-09-10 DIAGNOSIS — M1712 Unilateral primary osteoarthritis, left knee: Secondary | ICD-10-CM | POA: Diagnosis not present

## 2023-10-08 ENCOUNTER — Telehealth: Payer: Self-pay

## 2023-10-08 NOTE — Telephone Encounter (Signed)
 Advised patient that Dr. Cleatus is not in the office and will not return until next week. But once received will forward message to Dr. Cleatus and this is to also make sure Dr. Cleatus does not want any additional lab work. Patient understood.

## 2023-10-08 NOTE — Telephone Encounter (Signed)
 Copied from CRM [id number]. Topic: Clinical - Request for Lab/Test Order >> Oct 08, 2023 10:42 AM [name] I wrote: Reason for CRM: Patient would like to testosterone checked and prostate checked through blood work if possible.

## 2023-10-17 NOTE — Telephone Encounter (Signed)
 Please set him up for a yearly visit.  We don't normally check PSA in a patient this young.  Please see what symptoms he has, ie what prompted the lab request re: PSA and testosterone, and let me know.  Thanks.

## 2023-10-18 NOTE — Telephone Encounter (Signed)
 Please see if he can get schedule for an early AM appointment.  I think it would be best to talk about his situation and then get labs done at the visit based on that.  Thanks.

## 2023-10-18 NOTE — Telephone Encounter (Signed)
 Patient has been scheduled to be seen on 11/05/23. The reason he wanted to see about a PSA test is because he has been experiencing some issues periodically with urination.

## 2023-10-19 NOTE — Telephone Encounter (Signed)
 Spoke with pt scheduling OV on 10/30/23 at 8:00 for urinary sxs. Fyi to Dr Cleatus.

## 2023-10-20 ENCOUNTER — Other Ambulatory Visit: Payer: Self-pay | Admitting: Family Medicine

## 2023-10-21 NOTE — Telephone Encounter (Signed)
 Noted. Thanks.

## 2023-10-22 DIAGNOSIS — M1712 Unilateral primary osteoarthritis, left knee: Secondary | ICD-10-CM | POA: Diagnosis not present

## 2023-10-30 ENCOUNTER — Ambulatory Visit: Admitting: Family Medicine

## 2023-11-05 ENCOUNTER — Encounter: Admitting: Family Medicine

## 2023-12-07 ENCOUNTER — Ambulatory Visit (INDEPENDENT_AMBULATORY_CARE_PROVIDER_SITE_OTHER): Admitting: Family Medicine

## 2023-12-07 ENCOUNTER — Encounter: Payer: Self-pay | Admitting: Family Medicine

## 2023-12-07 VITALS — BP 124/82 | HR 68 | Temp 97.9°F | Ht 71.77 in | Wt 376.6 lb

## 2023-12-07 DIAGNOSIS — R399 Unspecified symptoms and signs involving the genitourinary system: Secondary | ICD-10-CM

## 2023-12-07 DIAGNOSIS — I10 Essential (primary) hypertension: Secondary | ICD-10-CM

## 2023-12-07 DIAGNOSIS — R5383 Other fatigue: Secondary | ICD-10-CM | POA: Diagnosis not present

## 2023-12-07 DIAGNOSIS — G4733 Obstructive sleep apnea (adult) (pediatric): Secondary | ICD-10-CM | POA: Diagnosis not present

## 2023-12-07 DIAGNOSIS — Z7189 Other specified counseling: Secondary | ICD-10-CM

## 2023-12-07 DIAGNOSIS — F419 Anxiety disorder, unspecified: Secondary | ICD-10-CM

## 2023-12-07 DIAGNOSIS — Z0001 Encounter for general adult medical examination with abnormal findings: Secondary | ICD-10-CM

## 2023-12-07 DIAGNOSIS — R0602 Shortness of breath: Secondary | ICD-10-CM | POA: Diagnosis not present

## 2023-12-07 MED ORDER — METOPROLOL SUCCINATE ER 50 MG PO TB24: 25.0000 mg | ORAL_TABLET | Freq: Every day | ORAL | Status: AC

## 2023-12-07 NOTE — Patient Instructions (Signed)
 Refer to pulmonary.  I would get a flu shot each fall.   Go to the lab on the way out.   If you have mychart we'll likely use that to update you.    Take care.  Glad to see you. Gradually cut back on caffeine.

## 2023-12-07 NOTE — Progress Notes (Signed)
 CPE- See plan.  Routine anticipatory guidance given to patient.  See health maintenance.  The possibility exists that previously documented standard health maintenance information may have been brought forward from a previous encounter into this note.  If needed, that same information has been updated to reflect the current situation based on today's encounter.    Tetanus 2023 Flu encouraged.   PNA 2020 Shingles not due.  PSA screening d/w pt.   Living will d/w pt. Sister Maeola Daring designated if patient were incapacitated.  Diet and exercise d/w pt.   Slower stream, some terminal dribbling.  Sig caffeine intake.  No blood in urine.  Discussed with patient about deferring PSA screening given his age.  Fatigue noted.  H/o ED.  OSA on CPAP.  He can still wake tired.  No recent sleep study.    Mood d/w pt. Effexor  helped his mood.  Not depressed, no SI/HI.    Hypertension:    Using medication without problems or lightheadedness:  yes Chest pain with exertion: no Edema:no Short of breath: some SOB, not consistent.  Random onset.  Can happen going up steps. Not SOB supine.    PMH and SH reviewed  Meds, vitals, and allergies reviewed.   ROS: Per HPI.  Unless specifically indicated otherwise in HPI, the patient denies:  General: fever. Eyes: acute vision changes ENT: sore throat Cardiovascular: chest pain Respiratory: SOB GI: vomiting GU: dysuria Musculoskeletal: acute back pain Derm: acute rash Neuro: acute motor dysfunction Psych: worsening mood Endocrine: polydipsia Heme: bleeding Allergy: hayfever  GEN: nad, alert and oriented HEENT: mucous membranes moist NECK: supple w/o LA CV: rrr. PULM: ctab, no inc wob ABD: soft, +bs EXT: no edema SKIN:well perfused.

## 2023-12-07 NOTE — Assessment & Plan Note (Signed)
 Living will d/w pt. Sister Maeola Daring designated if patient were incapacitated.

## 2023-12-08 LAB — CBC WITH DIFFERENTIAL/PLATELET
Absolute Lymphocytes: 4432 {cells}/uL — ABNORMAL HIGH (ref 850–3900)
Absolute Monocytes: 1936 {cells}/uL — ABNORMAL HIGH (ref 200–950)
Basophils Absolute: 144 {cells}/uL (ref 0–200)
Basophils Relative: 0.9 %
Eosinophils Absolute: 320 {cells}/uL (ref 15–500)
Eosinophils Relative: 2 %
HCT: 43.9 % (ref 38.5–50.0)
Hemoglobin: 15.2 g/dL (ref 13.2–17.1)
MCH: 29.1 pg (ref 27.0–33.0)
MCHC: 34.6 g/dL (ref 32.0–36.0)
MCV: 84.1 fL (ref 80.0–100.0)
MPV: 11.1 fL (ref 7.5–12.5)
Monocytes Relative: 12.1 %
Neutro Abs: 9168 {cells}/uL — ABNORMAL HIGH (ref 1500–7800)
Neutrophils Relative %: 57.3 %
Platelets: 569 Thousand/uL — ABNORMAL HIGH (ref 140–400)
RBC: 5.22 Million/uL (ref 4.20–5.80)
RDW: 12.9 % (ref 11.0–15.0)
Total Lymphocyte: 27.7 %
WBC: 16 Thousand/uL — ABNORMAL HIGH (ref 3.8–10.8)

## 2023-12-08 LAB — LIPID PANEL
Cholesterol: 173 mg/dL (ref <200)
HDL: 32 mg/dL — ABNORMAL LOW (ref >=40)
LDL Cholesterol (Calc): 109 mg/dL — ABNORMAL HIGH
Non-HDL Cholesterol (Calc): 141 mg/dL — ABNORMAL HIGH (ref <130)
Total CHOL/HDL Ratio: 5.4 (calc) — ABNORMAL HIGH (ref <5.0)
Triglycerides: 198 mg/dL — ABNORMAL HIGH (ref <150)

## 2023-12-08 LAB — COMPREHENSIVE METABOLIC PANEL WITH GFR
AG Ratio: 1.4 (calc) (ref 1.0–2.5)
ALT: 20 U/L (ref 9–46)
AST: 15 U/L (ref 10–40)
Albumin: 4.2 g/dL (ref 3.6–5.1)
Alkaline phosphatase (APISO): 98 U/L (ref 36–130)
BUN: 12 mg/dL (ref 7–25)
CO2: 27 mmol/L (ref 20–32)
Calcium: 9.9 mg/dL (ref 8.6–10.3)
Chloride: 98 mmol/L (ref 98–110)
Creat: 0.8 mg/dL (ref 0.60–1.29)
Globulin: 3.1 g/dL (ref 1.9–3.7)
Glucose, Bld: 195 mg/dL — ABNORMAL HIGH (ref 65–99)
Potassium: 4.5 mmol/L (ref 3.5–5.3)
Sodium: 136 mmol/L (ref 135–146)
Total Bilirubin: 0.3 mg/dL (ref 0.2–1.2)
Total Protein: 7.3 g/dL (ref 6.1–8.1)
eGFR: 109 mL/min/1.73m2 (ref >=60)

## 2023-12-08 LAB — TSH: TSH: 1.3 m[IU]/L (ref 0.40–4.50)

## 2023-12-08 LAB — BRAIN NATRIURETIC PEPTIDE: Brain Natriuretic Peptide: 16 pg/mL (ref <100)

## 2023-12-09 ENCOUNTER — Ambulatory Visit: Payer: Self-pay | Admitting: Family Medicine

## 2023-12-09 DIAGNOSIS — R739 Hyperglycemia, unspecified: Secondary | ICD-10-CM

## 2023-12-09 DIAGNOSIS — R399 Unspecified symptoms and signs involving the genitourinary system: Secondary | ICD-10-CM | POA: Insufficient documentation

## 2023-12-09 NOTE — Assessment & Plan Note (Signed)
 Effexor  helped his mood.  Not depressed, no SI/HI.  Continue Effexor  as is.

## 2023-12-09 NOTE — Assessment & Plan Note (Signed)
 Tetanus 2023 Flu encouraged.   PNA 2020 Shingles not due.  PSA screening d/w pt.   Living will d/w pt. Sister Maeola Daring designated if patient were incapacitated.  Diet and exercise d/w pt.

## 2023-12-09 NOTE — Assessment & Plan Note (Signed)
 Refer back to pulmonary for updated sleep test.  Unclear how much this is affecting his fatigue.

## 2023-12-09 NOTE — Assessment & Plan Note (Signed)
 Chronic issue, not suggestive of acute infection.  Likely exacerbated by caffeine intake.  Would defer PSA screening at this point in taper caffeine.

## 2023-12-09 NOTE — Assessment & Plan Note (Signed)
 At this point still okay for outpatient follow-up.  Discussed diet and exercise.  Discussed weight management.  Discussed sleep apnea.  Continue metoprolol  for now.  See notes on labs regarding shortness of breath.

## 2023-12-14 ENCOUNTER — Ambulatory Visit (INDEPENDENT_AMBULATORY_CARE_PROVIDER_SITE_OTHER): Admitting: Sleep Medicine

## 2023-12-14 ENCOUNTER — Encounter: Payer: Self-pay | Admitting: Sleep Medicine

## 2023-12-14 VITALS — BP 126/82 | HR 73 | Temp 97.5°F | Ht 71.77 in | Wt 378.6 lb

## 2023-12-14 DIAGNOSIS — I10 Essential (primary) hypertension: Secondary | ICD-10-CM | POA: Diagnosis not present

## 2023-12-14 DIAGNOSIS — G4733 Obstructive sleep apnea (adult) (pediatric): Secondary | ICD-10-CM

## 2023-12-14 DIAGNOSIS — Z6841 Body Mass Index (BMI) 40.0 and over, adult: Secondary | ICD-10-CM | POA: Diagnosis not present

## 2023-12-14 NOTE — Patient Instructions (Signed)

## 2023-12-14 NOTE — Progress Notes (Signed)
 Name:[name] MRN: [id number] DOB: Sep 12, 1975   CHIEF COMPLAINT:  ESTABLISH CARE FOR OSA   HISTORY OF PRESENT ILLNESS: Mr. Minichiello is a 48 y.o. w/ a h/o OSA, HTN, anxiety, depression and morbid obesity who presents to establish care for OSA. Reports that he was initially diagnosed with OSA in 2017 and was subsequently started on CPAP therapy. Reports using CPAP therapy every night, which is confirmed by compliance data. He is currently using the Airfit F20 FFM, reports intermittent air leaks. Reports feeling unrefreshed upon awakening with CPAP therapy. Denies any nocturnal awakenings. Reports a 40 lb weight gain over the last few years. Admits to dry mouth. Denies morning headaches, RLS symptoms, dream enactment, cataplexy, hypnagogic or hypnapompic hallucinations. Reports a family history of sleep apnea. Denies drowsy driving. Drinks 1-2 sodas daily, occasional alcohol use, former smoker, denies illicit drug use.   Bedtime 10 pm- 12 am Sleep onset 10 mins Rise time 6 am   EPWORTH SLEEP SCORE 14    12/14/2023    9:37 AM 12/09/2015   11:00 AM  Results of the Epworth flowsheet  Sitting and reading 2 3  Watching TV 2 3  Sitting, inactive in a public place (e.g. a theatre or a meeting) 2 3  As a passenger in a car for an hour without a break 2 3  Lying down to rest in the afternoon when circumstances permit 3 3  Sitting and talking to someone 0 0  Sitting quietly after a lunch without alcohol 2 3  In a car, while stopped for a few minutes in traffic 1 3  Total score 14 21     PAST MEDICAL HISTORY :   has a past medical history of Allergy, Anemia, Anxiety, and Diabetes mellitus without complication (HCC).  has a past surgical history that includes Carpal tunnel release; Splenectomy, total; and Plantar fascia surgery (Left). Prior to Admission medications   Medication Sig Start Date End Date Taking? Authorizing Provider  metoprolol  succinate (TOPROL -XL) 50 MG 24 hr tablet  Take 0.5 tablets (25 mg total) by mouth daily. TAKE WITH OR IMMEDIATELY FOLLOWING A MEAL. 12/07/23   Cleatus Arlyss RAMAN, MD  sildenafil  (REVATIO ) 20 MG tablet Take 1-5 tablets (20-100 mg total) by mouth daily as needed. 07/08/23   Cleatus Arlyss RAMAN, MD  venlafaxine  XR (EFFEXOR -XR) 75 MG 24 hr capsule TAKE 1 CAPSULE BY MOUTH DAILY WITH BREAKFAST. 10/22/23   Cleatus Arlyss RAMAN, MD   Allergies  Allergen Reactions   Aspirin     Nosebleeds    FAMILY HISTORY:  family history includes Anemia in his maternal grandmother; Arthritis in his father and mother; Diabetes in his father and mother; Heart disease in his mother; Hyperlipidemia in his mother; Hypertension in his mother. SOCIAL HISTORY:  reports that he quit smoking about 11 years ago. His smoking use included cigarettes. He started smoking about 28 years ago. He has a 17 pack-year smoking history. His smokeless tobacco use includes snuff. He reports that he does not drink alcohol and does not use drugs.   Review of Systems:  Gen:  Denies  fever, sweats, chills weight loss  HEENT: Denies blurred vision, double vision, ear pain, eye pain, hearing loss, nose bleeds, sore throat Cardiac:  No dizziness, chest pain or heaviness, chest tightness,edema, No JVD Resp:   No cough, -sputum production, -shortness of breath,-wheezing, -hemoptysis,  Gi: Denies swallowing difficulty, stomach pain, nausea or vomiting, diarrhea, constipation, bowel incontinence Gu:  Denies bladder  incontinence, burning urine Ext:   Denies Joint pain, stiffness or swelling Skin: Denies  skin rash, easy bruising or bleeding or hives Endoc:  Denies polyuria, polydipsia , polyphagia or weight change Psych:   Denies depression, insomnia or hallucinations  Other:  All other systems negative  VITAL SIGNS: BP 126/82   Pulse 73   Temp (!) 97.5 F (36.4 C)   Ht 5' 11.77 (1.823 m)   Wt (!) 378 lb 9.6 oz (171.7 kg)   SpO2 95%   BMI 51.68 kg/m    Physical Examination:   General  Appearance: No distress  EYES PERRLA, EOM intact.   NECK Supple, No JVD Pulmonary: normal breath sounds, No wheezing.  CardiovascularNormal S1,S2.  No m/r/g.   Abdomen: Benign, Soft, non-tender. Skin:   warm, no rashes, no ecchymosis  Extremities: normal, no cyanosis, clubbing. Neuro:without focal findings,  speech normal  PSYCHIATRIC: Mood, affect within normal limits.   ASSESSMENT AND PLAN  OSA Patient is using and benefiting from CPAP therapy. Counseled patient on increasing total sleep time to 7-8 hours per night. Due to weight changes, will increase MIN pressure to 16 cm H2O. Discussed the consequences of untreated sleep apnea. Advised not to drive drowsy for safety of patient and others. Will follow up in 3 months.    HTN Stable, on current management. Following with PCP.   Morbid obesity Counseled patient on diet and lifestyle modification.    Patient  satisfied with Plan of action and management. All questions answered   I spent a total of 46 minutes reviewing chart data, face-to-face evaluation with the patient, counseling and coordination of care as detailed above.    Lucyle Alumbaugh, M.D.  Sleep [facility]

## 2023-12-21 DIAGNOSIS — M1712 Unilateral primary osteoarthritis, left knee: Secondary | ICD-10-CM | POA: Diagnosis not present

## 2024-01-04 ENCOUNTER — Other Ambulatory Visit: Payer: Self-pay | Admitting: Family Medicine

## 2024-02-20 ENCOUNTER — Encounter: Payer: Self-pay | Admitting: Intensive Care

## 2024-02-20 ENCOUNTER — Emergency Department

## 2024-02-20 ENCOUNTER — Emergency Department
Admission: EM | Admit: 2024-02-20 | Discharge: 2024-02-20 | Disposition: A | Discharge status: Home / Self Care | Attending: Emergency Medicine | Admitting: Emergency Medicine

## 2024-02-20 ENCOUNTER — Other Ambulatory Visit: Payer: Self-pay

## 2024-02-20 DIAGNOSIS — R079 Chest pain, unspecified: Secondary | ICD-10-CM | POA: Insufficient documentation

## 2024-02-20 DIAGNOSIS — D72829 Elevated white blood cell count, unspecified: Secondary | ICD-10-CM | POA: Diagnosis not present

## 2024-02-20 DIAGNOSIS — I10 Essential (primary) hypertension: Secondary | ICD-10-CM | POA: Diagnosis not present

## 2024-02-20 DIAGNOSIS — M79601 Pain in right arm: Secondary | ICD-10-CM | POA: Diagnosis not present

## 2024-02-20 DIAGNOSIS — R739 Hyperglycemia, unspecified: Secondary | ICD-10-CM | POA: Diagnosis not present

## 2024-02-20 DIAGNOSIS — R0989 Other specified symptoms and signs involving the circulatory and respiratory systems: Secondary | ICD-10-CM | POA: Diagnosis not present

## 2024-02-20 DIAGNOSIS — I517 Cardiomegaly: Secondary | ICD-10-CM | POA: Diagnosis not present

## 2024-02-20 HISTORY — DX: Essential (primary) hypertension: I10

## 2024-02-20 LAB — TROPONIN T, HIGH SENSITIVITY
Troponin T High Sensitivity: <15 ng/L (ref 0–19)
Troponin T High Sensitivity: <15 ng/L (ref 0–19)

## 2024-02-20 LAB — CBC
HCT: 45.2 % (ref 39.0–52.0)
Hemoglobin: 16 g/dL (ref 13.0–17.0)
MCH: 29.9 pg (ref 26.0–34.0)
MCHC: 35.4 g/dL (ref 30.0–36.0)
MCV: 84.5 fL (ref 80.0–100.0)
Platelets: 534 K/uL — ABNORMAL HIGH (ref 150–400)
RBC: 5.35 MIL/uL (ref 4.22–5.81)
RDW: 12.8 % (ref 11.5–15.5)
WBC: 15.9 K/uL — ABNORMAL HIGH (ref 4.0–10.5)
nRBC: 0 % (ref 0.0–0.2)

## 2024-02-20 LAB — PRO BRAIN NATRIURETIC PEPTIDE: Pro Brain Natriuretic Peptide: <50 pg/mL (ref <300.0)

## 2024-02-20 LAB — BASIC METABOLIC PANEL WITH GFR
Anion gap: 11 (ref 5–15)
BUN: 8 mg/dL (ref 6–20)
CO2: 27 mmol/L (ref 22–32)
Calcium: 9.7 mg/dL (ref 8.9–10.3)
Chloride: 97 mmol/L — ABNORMAL LOW (ref 98–111)
Creatinine, Ser: 0.85 mg/dL (ref 0.61–1.24)
GFR, Estimated: >60 mL/min (ref >60)
Glucose, Bld: 449 mg/dL — ABNORMAL HIGH (ref 70–99)
Potassium: 4.5 mmol/L (ref 3.5–5.1)
Sodium: 135 mmol/L (ref 135–145)

## 2024-02-20 LAB — CBG MONITORING, ED: Glucose-Capillary: 297 mg/dL — ABNORMAL HIGH (ref 70–99)

## 2024-02-20 MED ORDER — INSULIN ASPART 100 UNIT/ML IJ SOLN
5.0000 [IU] | Freq: Once | INTRAMUSCULAR | Status: AC
  Administered 2024-02-20: 5 [IU] via INTRAVENOUS
  Filled 2024-02-20: qty 5

## 2024-02-20 MED ORDER — SODIUM CHLORIDE 0.9 % IV BOLUS
1000.0000 mL | Freq: Once | INTRAVENOUS | Status: AC
  Administered 2024-02-20: 1000 mL via INTRAVENOUS

## 2024-02-20 MED ORDER — METFORMIN HCL 500 MG PO TABS: 500.0000 mg | ORAL_TABLET | Freq: Two times a day (BID) | ORAL | 0 refills | Status: DC

## 2024-02-20 NOTE — ED Notes (Signed)
 Called CCMD to add pt to monitoring.

## 2024-02-20 NOTE — ED Triage Notes (Signed)
 Patient c/o left sided chest pain. Unable to describe. Reports numbness and pain in left shoulder that radiates down to left hand, pinky. Started this AM

## 2024-02-20 NOTE — Discharge Instructions (Addendum)
 You are seen in the ER today for evaluation of your chest and arm pain.  Your testing fortunately did not show an emergency cause for this.  As we discussed your blood sugar was very elevated.  I am concerned that you may have diabetes.  I sent a prescription for a medicine to help with this to your pharmacy, but is very important you follow-up with your primary care doctor for further evaluation of this.  You can also discuss your chest and arm pain with them.  I have placed a referral to cardiology as well for further evaluation of your chest pain.  Return to the ER for new or worsening symptoms.

## 2024-02-20 NOTE — ED Provider Notes (Signed)
 [facility] Provider Note    Event Date/Time   First MD Initiated Contact with Patient 02/20/24 1107     (approximate)   History   Chest Pain   HPI  [name] is a 48 year old male with history of hypertension, obesity presenting to the emergency department for evaluation of chest and shoulder pain.  Patient reports that a few days ago he noticed that he was having some numbness and tingling along the pinky and fourth digit of his left hand.  Later he noticed some discomfort in his left shoulder.  No recent trauma.  He also reports intermittent episodes of chest discomfort can occur both on the right and left side, not persistent.  Pain worsens this morning.  Denies known history of cardiopulmonary issues.     Physical Exam   Triage Vital Signs: ED Triage Vitals  Encounter Vitals Group     BP 02/20/24 1103 (!) 149/80     Girls Systolic BP Percentile --      Girls Diastolic BP Percentile --      Boys Systolic BP Percentile --      Boys Diastolic BP Percentile --      Pulse Rate 02/20/24 1103 74     Resp 02/20/24 1103 20     Temp 02/20/24 1103 98.1 F (36.7 C)     Temp Source 02/20/24 1103 Oral     SpO2 02/20/24 1103 96 %     Weight 02/20/24 1100 (!) 370 lb (167.8 kg)     Height 02/20/24 1100 6' (1.829 m)     Head Circumference --      Peak Flow --      Pain Score 02/20/24 1100 5     Pain Loc --      Pain Education --      Exclude from Growth Chart --     Most recent vital signs: Vitals:   02/20/24 1300 02/20/24 1330  BP: 132/69 118/61  Pulse: 69 73  Resp: (!) 25 20  Temp:    SpO2: 98% 97%     General: Awake, interactive  CV:  Good peripheral perfusion Resp:  Unlabored respirations, lungs clear to auscultation Chest wall: Nontender to palpation Abd:  Nondistended.  Neuro:  Symmetric facial movement, fluid speech Other:   No significant tenderness with range of motion of the left shoulder.  Sensation intact throughout the  bilateral arms, does report paresthesias over the 4th and 5th digits of the left hand.  No midline spinal tenderness.  Negative Spurling sign bilaterally.   ED Results / Procedures / Treatments   Labs (all labs ordered are listed, but only abnormal results are displayed) Labs Reviewed  BASIC METABOLIC PANEL WITH GFR - Abnormal; Notable for the following components:      Result Value   Chloride 97 (*)    Glucose, Bld 449 (*)    All other components within normal limits  CBC - Abnormal; Notable for the following components:   WBC 15.9 (*)    Platelets 534 (*)    All other components within normal limits  CBG MONITORING, ED - Abnormal; Notable for the following components:   Glucose-Capillary 297 (*)    All other components within normal limits  PRO BRAIN NATRIURETIC PEPTIDE  TROPONIN T, HIGH SENSITIVITY  TROPONIN T, HIGH SENSITIVITY     EKG EKG independently reviewed and interpreted by myself demonstrates:  EKG demonstrates sinus rhythm at a rate of 75, PR 178, QRS 100,  QTc 433, no acute ST changes  RADIOLOGY Imaging independently reviewed and interpreted by myself demonstrates:  CXR without focal consolidation   Formal Radiology Read:  [facility] Chest Port 1 View Result Date: 02/20/2024 CLINICAL DATA:  [id number] Chest pain [id number] EXAM: PORTABLE CHEST 1 VIEW COMPARISON:  08/18/2007 FINDINGS: Mild cardiac silhouette enlargement and vascular congestion. No acute CHF pattern or focal pneumonia. No large effusion or pneumothorax. Artifact overlies the right apex medially. Trachea midline. Degenerative changes of the spine. IMPRESSION: Cardiomegaly with vascular congestion. Electronically Signed   By: CHRISTELLA.  Shick M.D.   On: 02/20/2024 12:06    PROCEDURES:  Critical Care performed: No  Procedures   MEDICATIONS ORDERED IN ED: Medications  sodium chloride 0.9 % bolus 1,000 mL (1,000 mLs Intravenous New Bag/Given 02/20/24 1301)  insulin aspart (novoLOG) injection 5 Units (5 Units Intravenous  Given 02/20/24 1307)     IMPRESSION / MDM / ASSESSMENT AND PLAN / ED COURSE  I reviewed the triage vital signs and the nursing notes.  Differential diagnosis includes, but is not limited to, ACS, pneumonia, peripheral nerve pathology including ulnar nerve pathology, low risk PE and PERC negative  Patient's presentation is most consistent with acute presentation with potential threat to life or bodily function.  48 year old male presenting with chest pain and left shoulder pain.  EKG overall reassuring.  Will obtain labs, x-Karely Hurtado to further evaluate.  Labs with leukocytosis WBC 16, normal hemoglobin, BMP notable for hyperglycemia with glucose of 449, normal bicarb and anion gap, not consistent with DKA.  Negative troponin x 2.  proBNP normal.  Patient given IV fluids and insulin.  Did have improvement in his glucose to 297 following this.  He reports that he has been told he has prediabetes in the past but has never actually been diagnosed with diabetes or taking medication for this.  Discussed that he likely does have diabetes given his degree of hyperglycemia but this will need further evaluation as an outpatient.  He is agreeable to starting metformin from the emergency department.  Does have a primary care doctor he can arrange follow-up with.  We discussed the results of his workup.  Fortunately overall reassuring.  He will follow-up with his primary care doctor for his arm pain as well.  Will place a referral to cardiology for his chest pain.  Strict return precautions provided.  Patient discharged in stable condition.      FINAL CLINICAL IMPRESSION(S) / ED DIAGNOSES   Final diagnoses:  Nonspecific chest pain  Right arm pain  Hyperglycemia     Rx / DC Orders   ED Discharge Orders          Ordered    Ambulatory referral to Cardiology       Comments: If you have not heard from the Cardiology office within the next 72 hours please call [phone].   02/20/24 1418    metFORMIN  (GLUCOPHAGE) 500 MG tablet  2 times daily with meals        02/20/24 1419             Note:  This document was prepared using Dragon voice recognition software and may include unintentional dictation errors.   Levander Slate, MD 02/20/24 1420

## 2024-02-20 NOTE — ED Notes (Signed)
 Pt states that he has had a sharp pain in both of his shoulders over the last few days and reports some numbness and tingling in his lower fingers that started 2 days ago. Pt reports that CP started this morning. Pt states that he had a panic attack this morning being worried about what is going on. Pt is A&Ox4. Pt NAD at this time.

## 2024-03-26 ENCOUNTER — Other Ambulatory Visit: Payer: Self-pay | Admitting: Family Medicine

## 2024-03-26 NOTE — Telephone Encounter (Signed)
 Copied from CRM #[id number]. Topic: Clinical - Medication Refill >> Mar 26, 2024 10:55 AM Burnard DEL wrote: Medication: metFORMIN  (GLUCOPHAGE ) 500 MG tablet (Expired)(Patient was prescribed in the ER)  Has the patient contacted their pharmacy? No (Agent: If no, request that the patient contact the pharmacy for the refill. If patient does not wish to contact the pharmacy document the reason why and proceed with request.) (Agent: If yes, when and what did the pharmacy advise?)  This is the patient's preferred pharmacy:  [facility] [address] [place] - [name] [name] [address] Phone: [phone] Fax: [phone]  Is this the correct pharmacy for this prescription? Yes If no, delete pharmacy and type the correct one.   Has the prescription been filled recently? No  Is the patient out of the medication? Yes  Has the patient been seen for an appointment in the last year OR does the patient have an upcoming appointment? Yes  Can we respond through MyChart? Yes  Agent: Please be advised that Rx refills may take up to 3 business days. We ask that you follow-up with your pharmacy.

## 2024-03-27 ENCOUNTER — Ambulatory Visit: Payer: Self-pay | Admitting: Family Medicine

## 2024-03-27 MED ORDER — METFORMIN HCL 500 MG PO TABS: 500.0000 mg | ORAL_TABLET | Freq: Two times a day (BID) | ORAL | 0 refills | Status: DC

## 2024-03-27 NOTE — Telephone Encounter (Signed)
 FYI Only or Action Required?: Action required by provider: medication refill request. Pt requesting metformin  be filled and would like a call from clinic when sent.   Patient was last seen in primary care on 12/07/2023 by Cleatus Arlyss RAMAN, MD.  Called Nurse Triage reporting Medication Refill.  Symptoms began several days ago.  Interventions attempted: Nothing.  Symptoms are: gradually worsening.  Triage Disposition: Home Care  Patient/caregiver understands and will follow disposition?: No, wishes to speak with PCP  Reason for Disposition  [1] Headache AND [2] has not taken pain medications  Answer Assessment - Initial Assessment Questions Went to the ED a few weeks ago, sugar very high - prescribed metformin . Pt ran out of metformin  and states he is having headaches pain 4/10 comes and goes - onset 03/24/24. Pt requesting that the metformin  be filled and would like a call from clinic.   1. LOCATION: Where does it hurt?      Head  2. ONSET: When did the headache start? (e.g., minutes, hours, days)      03/24/24 3. PATTERN: Does the pain come and go, or has it been constant since it started?     Comes and goes  4. SEVERITY: How bad is the pain? and What does it keep you from doing?  (e.g., Scale 1-10; mild, moderate, or severe)     4/10 5. RECURRENT SYMPTOM: Have you ever had headaches before? If Yes, ask: When was the last time? and What happened that time?      N/a  6. CAUSE: What do you think is causing the headache?     Being without medication  7. MIGRAINE: Have you been diagnosed with migraine headaches? If Yes, ask: Is this headache similar?      N/a  8. HEAD INJURY: Has there been any recent injury to your head?      N/a  9. OTHER SYMPTOMS: Do you have any other symptoms? (e.g., fever, stiff neck, eye pain, sore throat, cold symptoms)     Denies  Protocols used: [facility]  Copied from CRM #[id number]. Topic: Clinical - Red Word Triage >> Mar 27, 2024  1:48 PM Victoria A wrote: [facility] that prompted transfer to Nurse Triage: Patient is out of Metformin  refill request was put in on 03/26/2024, patient is having headaches, feeling sluggish and overall not well

## 2024-03-27 NOTE — Telephone Encounter (Signed)
 Sent. Please schedule OV. Thanks.

## 2024-04-16 ENCOUNTER — Ambulatory Visit: Admitting: Family Medicine

## 2024-04-16 ENCOUNTER — Encounter: Payer: Self-pay | Admitting: Family Medicine

## 2024-04-16 VITALS — BP 130/64 | HR 91 | Temp 98.3°F | Ht 72.0 in | Wt 371.5 lb

## 2024-04-16 DIAGNOSIS — Z7984 Long term (current) use of oral hypoglycemic drugs: Secondary | ICD-10-CM

## 2024-04-16 DIAGNOSIS — E1165 Type 2 diabetes mellitus with hyperglycemia: Secondary | ICD-10-CM | POA: Diagnosis not present

## 2024-04-16 DIAGNOSIS — R051 Acute cough: Secondary | ICD-10-CM

## 2024-04-16 LAB — POCT INFLUENZA A/B
Influenza A, POC: NEGATIVE
Influenza B, POC: NEGATIVE

## 2024-04-16 LAB — POCT GLYCOSYLATED HEMOGLOBIN (HGB A1C): Hemoglobin A1C: 10.5 % — AB (ref 4.0–5.6)

## 2024-04-16 LAB — POC COVID19 BINAXNOW: SARS Coronavirus 2 Ag: NEGATIVE

## 2024-04-16 MED ORDER — METFORMIN HCL 500 MG PO TABS: 1000.0000 mg | ORAL_TABLET | Freq: Two times a day (BID) | ORAL | 0 refills | Status: AC

## 2024-04-16 NOTE — Assessment & Plan Note (Signed)
 Acute, most likely upper respiratory tract viral infection.  No sign of bacterial superinfection.  Negative COVID and flu testing. Recommend rest, fluids and symptomatic/supportive care.  Return and ER precautions provided.

## 2024-04-16 NOTE — Progress Notes (Signed)
 "   Patient ID: [name], male    DOB: [birth year], 49 y.o.   MRN: [id number]  This visit was conducted in person.  BP 130/64   Pulse 91   Temp 98.3 F (36.8 C) (Oral)   Ht 6' (1.829 m)   Wt (!) 371 lb 8 oz (168.5 kg)   SpO2 93%   BMI 50.38 kg/m    CC:  Chief Complaint  Patient presents with   Medical Management of Chronic Issues    Pt here for Congestion, cough, headache , sinus drainage and pressure x 4-5 days. No fever. Aslo wants to talk to you about his Metformin . States his sugar is 250 in the morning.    Subjective:   HPI: [name] is a 49 y.o. male presenting on 04/16/2024 for Medical Management of Chronic Issues (Pt here for Congestion, cough, headache , sinus drainage and pressure x 4-5 days. No fever. Aslo wants to talk to you about his Metformin . States his sugar is 250 in the morning.)  PCP [facility]   Date of onset:  4-5 days Initial symptoms included  nasal congestion,headache Symptoms progressed to chest congestion, cough productive.  No fever, occ chills   Some SOB with exertion, occ wheeze.  No ear pain, no face pain.  No body aches,   Sick contacts:   at work COVID testing:    none    He has tried to treat with  Dayquil and Nyquil... able to sleep at night     No history of chronic lung disease such as asthma or COPD.  Dips tobacco,  former  remote smoker.    Has DM on metformin  500 mg BID.... new diagnosis in 02/2024 in ER.. CBGs 450...  NO SE now.  Fasting in AM  CBGs 250 Lab Results  Component Value Date   HGBA1C 10.5 (A) 04/16/2024    BMET  GFR > 60     Relevant past medical, surgical, family and social history reviewed and updated as indicated. Interim medical history since our last visit reviewed. Allergies and medications reviewed and updated. Outpatient Medications Prior to Visit  Medication Sig Dispense Refill   metoprolol  succinate (TOPROL -XL) 50 MG 24 hr tablet Take 0.5 tablets (25 mg total) by mouth daily. TAKE WITH OR  IMMEDIATELY FOLLOWING A MEAL.     sildenafil  (REVATIO ) 20 MG tablet TAKE 1-5 TABLETS (20-100 MG TOTAL) BY MOUTH DAILY AS NEEDED 100 tablet 5   venlafaxine  XR (EFFEXOR -XR) 75 MG 24 hr capsule TAKE 1 CAPSULE BY MOUTH DAILY WITH BREAKFAST. 90 capsule 1   metFORMIN  (GLUCOPHAGE ) 500 MG tablet Take 1 tablet (500 mg total) by mouth 2 (two) times daily with a meal. 60 tablet 0   No facility-administered medications prior to visit.     Per HPI unless specifically indicated in ROS section below Review of Systems  Constitutional:  Negative for fatigue and fever.  HENT:  Negative for ear pain.   Eyes:  Negative for pain.  Respiratory:  Negative for cough and shortness of breath.   Cardiovascular:  Negative for chest pain, palpitations and leg swelling.  Gastrointestinal:  Negative for abdominal pain.  Genitourinary:  Negative for dysuria.  Musculoskeletal:  Negative for arthralgias.  Neurological:  Negative for syncope, light-headedness and headaches.  Psychiatric/Behavioral:  Negative for dysphoric mood.    Objective:  BP 130/64   Pulse 91   Temp 98.3 F (36.8 C) (Oral)   Ht 6' (1.829 m)   Wt (!) 371  lb 8 oz (168.5 kg)   SpO2 93%   BMI 50.38 kg/m   Wt Readings from Last 3 Encounters:  04/16/24 (!) 371 lb 8 oz (168.5 kg)  02/20/24 (!) 370 lb (167.8 kg)  12/14/23 (!) 378 lb 9.6 oz (171.7 kg)      Physical Exam Constitutional:      Appearance: He is well-developed.  HENT:     Head: Normocephalic.     Right Ear: Hearing, tympanic membrane, ear canal and external ear normal. There is no impacted cerumen.     Left Ear: Hearing, tympanic membrane, ear canal and external ear normal. There is no impacted cerumen.     Nose: Rhinorrhea present.     Mouth/Throat:     Mouth: Mucous membranes are moist.     Pharynx: Posterior oropharyngeal erythema present. No oropharyngeal exudate.  Eyes:     General:        Right eye: No discharge.        Left eye: No discharge.     Extraocular  Movements: Extraocular movements intact.     Conjunctiva/sclera: Conjunctivae normal.     Pupils: Pupils are equal, round, and reactive to light.  Neck:     Thyroid : No thyroid  mass or thyromegaly.     Vascular: No carotid bruit.     Trachea: Trachea normal.  Cardiovascular:     Rate and Rhythm: Normal rate and regular rhythm.     Pulses: Normal pulses.     Heart sounds: Heart sounds not distant. No murmur heard.    No friction rub. No gallop.     Comments: No peripheral edema Pulmonary:     Effort: Pulmonary effort is normal. No respiratory distress.     Breath sounds: Normal breath sounds. No stridor. No wheezing, rhonchi or rales.  Chest:     Chest wall: No tenderness.  Musculoskeletal:     Cervical back: Normal range of motion.  Skin:    General: Skin is warm and dry.     Findings: No rash.  Psychiatric:        Speech: Speech normal.        Behavior: Behavior normal.        Thought Content: Thought content normal.       Results for orders placed or performed in visit on 04/16/24  POCT glycosylated hemoglobin (Hb A1C)   Collection Time: 04/16/24 11:16 AM  Result Value Ref Range   Hemoglobin A1C 10.5 (A) 4.0 - 5.6 %   HbA1c POC (<> result, manual entry)     HbA1c, POC (prediabetic range)     HbA1c, POC (controlled diabetic range)      Assessment and Plan  Acute cough Assessment & Plan: Acute, most likely upper respiratory tract viral infection.  No sign of bacterial superinfection.  Negative COVID and flu testing. Recommend rest, fluids and symptomatic/supportive care.  Return and ER precautions provided.  Orders: -     POCT Influenza A/B -     POC COVID-19 BinaxNow  Type 2 diabetes mellitus with hyperglycemia, without long-term current use of insulin  [facility]) Assessment & Plan: New diagnosis Poor control despite metformin  500 mg twice daily.  Increase metformin  to 1000 mg p.o. twice daily.  Discussed lifestyle changes and low carbohydrate diet in detail with  patient.  Offered nutrition referral but he states that he knows what to do he just has to make the changes. Recommended to close follow-up after recording of blood sugars on new dose of metformin   in 2 weeks with PCP. Reviewed goal blood sugars pre and postprandial.  Orders: -     POCT glycosylated hemoglobin (Hb A1C)  Other orders -     metFORMIN  HCl; Take 2 tablets (1,000 mg total) by mouth 2 (two) times daily with a meal.  Dispense: 120 tablet; Refill: 0    Return in about 2 weeks (around 04/30/2024) for  follow up new diagnosis Diabetes  with  [facility] PCP.   Greig Ring, MD  "

## 2024-04-16 NOTE — Assessment & Plan Note (Signed)
 New diagnosis Poor control despite metformin  500 mg twice daily.  Increase metformin  to 1000 mg p.o. twice daily.  Discussed lifestyle changes and low carbohydrate diet in detail with patient.  Offered nutrition referral but he states that he knows what to do he just has to make the changes. Recommended to close follow-up after recording of blood sugars on new dose of metformin  in 2 weeks with PCP. Reviewed goal blood sugars pre and postprandial.

## 2024-04-19 ENCOUNTER — Encounter: Payer: Self-pay | Admitting: Emergency Medicine

## 2024-04-19 ENCOUNTER — Ambulatory Visit
Admission: EM | Admit: 2024-04-19 | Discharge: 2024-04-19 | Disposition: A | Discharge status: Home / Self Care | Attending: Emergency Medicine | Admitting: Emergency Medicine

## 2024-04-19 DIAGNOSIS — J069 Acute upper respiratory infection, unspecified: Secondary | ICD-10-CM

## 2024-04-19 MED ORDER — BENZONATATE 100 MG PO CAPS: 100.0000 mg | ORAL_CAPSULE | Freq: Three times a day (TID) | ORAL | 0 refills | Status: AC

## 2024-04-19 MED ORDER — PROMETHAZINE-DM 6.25-15 MG/5ML PO SYRP: 5.0000 mL | ORAL_SOLUTION | Freq: Every evening | ORAL | 0 refills | Status: AC | PRN

## 2024-04-19 MED ORDER — AMOXICILLIN-POT CLAVULANATE 875-125 MG PO TABS: 1.0000 | ORAL_TABLET | Freq: Two times a day (BID) | ORAL | 0 refills | Status: AC

## 2024-04-19 NOTE — ED Triage Notes (Signed)
 Patient complains nasal congestion. Itchy eyes, chest congestion, and sore throat x 6 days. Patient seen primary doctor and was told viral infection and was not given any medication. Patient has been taking mucinex no relief. Rates pain 8/10.

## 2024-04-19 NOTE — Discharge Instructions (Signed)
 Today you are being treated for upper respiratory infection  Take Augmentin  twice daily for 7 days for coverage of bacteria  You may use Tessalon  pill every 8 hours as needed for coughing and you may use cough syrup at bedtime to allow for rest  You can take Tylenol and/or Ibuprofen as needed for fever reduction and pain relief.   For cough: honey 1/2 to 1 teaspoon (you can dilute the honey in water or another fluid).  You can also use guaifenesin and dextromethorphan for cough. You can use a humidifier for chest congestion and cough.  If you don't have a humidifier, you can sit in the bathroom with the hot shower running.      For sore throat: try warm salt water gargles, cepacol lozenges, throat spray, warm tea or water with lemon/honey, popsicles or ice, or OTC cold relief medicine for throat discomfort.   For congestion: take a daily anti-histamine like Zyrtec, Claritin, and a oral decongestant, such as pseudoephedrine.  You can also use Flonase  1-2 sprays in each nostril daily.   It is important to stay hydrated: drink plenty of fluids (water, gatorade/powerade/pedialyte, juices, or teas) to keep your throat moisturized and help further relieve irritation/discomfort.

## 2024-04-19 NOTE — ED Provider Notes (Signed)
 " [name]    CSN: [id number] Arrival date & time: 04/19/24  0827      History   Chief Complaint Chief Complaint  Patient presents with   Nasal Congestion   Sore Throat    HPI [name] is a 49 y.o. male.   Sinus  pressure forehead, HA  for 6 days productive cough, sob and wheezing with cough,   Covid and flu negative, seen 1/31 Eating, normal   Dayquil, guafensin , flonase    Everything is worse  Nose and eyes itching,   Sick contact at work     Past Medical History:  Diagnosis Date   Allergy    Anemia    hereditary spherocytosis, resolved after splenectomy in childhood per patient   Anxiety    Diabetes mellitus without complication (HCC)    Hypertension     Patient Active Problem List   Diagnosis Date Noted   Type 2 diabetes mellitus with hyperglycemia, without long-term current use of insulin  (HCC) 04/16/2024   Lower urinary tract symptoms (LUTS) 12/09/2023   Erectile dysfunction 06/20/2023   ETD (eustachian tube dysfunction) 11/26/2022   Vertigo 11/26/2022   Atypical chest pain 07/16/2022   Paresthesia 05/14/2022   Low back pain 11/03/2019   Encounter for general adult medical examination with abnormal findings 09/01/2018   Advance care planning 09/01/2018   HTN (hypertension) 02/07/2016   Anxiety 02/07/2016   OSA on CPAP 12/09/2015   Nocturia 12/09/2015   Acute cough 03/01/2015   Pain in joint, ankle and foot 09/30/2014   History of diabetes mellitus 01/14/2012   AC joint pain 01/14/2012   Obesity 02/28/2011   Smoking 02/28/2011   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 Authorizing Provider  metFORMIN  (GLUCOPHAGE ) 500 MG tablet Take 2 tablets (1,000 mg total) by mouth 2 (two) times  daily with a meal. 04/16/24 05/16/24  Bedsole, Amy E, MD  metoprolol  succinate (TOPROL -XL) 50 MG 24 hr tablet Take 0.5 tablets (25 mg total) by mouth daily. TAKE WITH OR IMMEDIATELY FOLLOWING A MEAL. 12/07/23   [facility] Arlyss RAMAN, MD  sildenafil  (REVATIO ) 20 MG tablet TAKE 1-5 TABLETS (20-100 MG TOTAL) BY MOUTH DAILY AS NEEDED 01/07/24   [facility] Arlyss RAMAN, MD  venlafaxine  XR (EFFEXOR -XR) 75 MG 24 hr capsule TAKE 1 CAPSULE BY MOUTH DAILY WITH BREAKFAST. 10/22/23   [facility] Arlyss RAMAN, MD    Family History Family History  Problem Relation Age of Onset   Arthritis Mother    Diabetes Mother    Hypertension Mother    Hyperlipidemia Mother    Heart disease Mother    Arthritis Father    Diabetes Father    Anemia Maternal Grandmother    Colon cancer Neg Hx    Prostate cancer Neg Hx     Social History Social History[1]   Allergies   Aspirin   Review of Systems Review of Systems   Physical Exam Triage Vital Signs ED Triage Vitals  Encounter Vitals Group     BP 04/19/24 0855 (!) 148/89     Girls Systolic BP Percentile --      Girls Diastolic BP Percentile --      Boys Systolic BP Percentile --  Boys Diastolic BP Percentile --      Pulse Rate 04/19/24 0855 79     Resp 04/19/24 0855 20     Temp 04/19/24 0855 98 F (36.7 C)     Temp Source 04/19/24 0855 Oral     SpO2 04/19/24 0855 95 %     Weight --      Height --      Head Circumference --      Peak Flow --      Pain Score 04/19/24 0859 8     Pain Loc --      Pain Education --      Exclude from Growth Chart --    No data found.  Updated Vital Signs BP (!) 148/89 (BP Location: Right Arm)   Pulse 79   Temp 98 F (36.7 C) (Oral)   Resp 20   SpO2 95%   Visual Acuity Right Eye Distance:   Left Eye Distance:   Bilateral Distance:    Right Eye Near:   Left Eye Near:    Bilateral Near:     Physical Exam Constitutional:      Appearance: Normal appearance.  HENT:     Right Ear: Tympanic membrane, ear canal and  external ear normal.     Left Ear: Tympanic membrane, ear canal and external ear normal.     Nose: Congestion present. No rhinorrhea.     Right Sinus: No maxillary sinus tenderness or frontal sinus tenderness.     Left Sinus: No maxillary sinus tenderness or frontal sinus tenderness.     Mouth/Throat:     Pharynx: Posterior oropharyngeal erythema present. No oropharyngeal exudate.  Cardiovascular:     Rate and Rhythm: Normal rate and regular rhythm.     Pulses: Normal pulses.     Heart sounds: Normal heart sounds.  Pulmonary:     Effort: Pulmonary effort is normal.     Breath sounds: Normal breath sounds.  Musculoskeletal:     Cervical back: Normal range of motion and neck supple.  Lymphadenopathy:     Cervical: No cervical adenopathy.  Neurological:     Mental Status: He is alert and oriented to person, place, and time. Mental status is at baseline.      UC Treatments / Results  Labs (all labs ordered are listed, but only abnormal results are displayed) Labs Reviewed - No data to display  EKG   Radiology No results found.  Procedures Procedures (including critical care time)  Medications Ordered in UC Medications - No data to display  Initial Impression / Assessment and Plan / UC Course  I have reviewed the triage vital signs and the nursing notes.  Pertinent labs & imaging results that were available during my care of the patient were reviewed by me and considered in my medical decision making (see chart for details).     *** Final Clinical Impressions(s) / UC Diagnoses   Final diagnoses:  None   Discharge Instructions   None    ED Prescriptions   None    PDMP not reviewed this encounter.    [1]  Social History Tobacco Use   Smoking status: Former    Current packs/day: 0.00    Average packs/day: 1 pack/day for 17.0 years (17.0 ttl pk-yrs)    Types: Cigarettes    Start date: 87    Quit date: 2014    Years since quitting: 12.0   Smokeless  tobacco: Current    Types: Snuff  Tobacco comments:    since 02/2013  Vaping Use   Vaping status: Never Used  Substance Use Topics   Alcohol use: No    Alcohol/week: 0.0 standard drinks of alcohol   Drug use: No   "

## 2024-04-23 ENCOUNTER — Other Ambulatory Visit: Payer: Self-pay | Admitting: Family Medicine

## 2024-05-01 ENCOUNTER — Ambulatory Visit: Admitting: Family Medicine
